# Patient Record
Sex: Female | Born: 1960 | Race: White | Hispanic: No | Marital: Married | Smoking: Former smoker
Health system: Southern US, Community
[De-identification: ages and names within clinical notes are randomized; demographics above are authoritative.]

## PROBLEM LIST (undated history)

## (undated) DIAGNOSIS — E039 Hypothyroidism, unspecified: Secondary | ICD-10-CM

## (undated) DIAGNOSIS — K219 Gastro-esophageal reflux disease without esophagitis: Secondary | ICD-10-CM

## (undated) DIAGNOSIS — I10 Essential (primary) hypertension: Secondary | ICD-10-CM

## (undated) DIAGNOSIS — E079 Disorder of thyroid, unspecified: Secondary | ICD-10-CM

## (undated) DIAGNOSIS — M797 Fibromyalgia: Secondary | ICD-10-CM

## (undated) DIAGNOSIS — F419 Anxiety disorder, unspecified: Secondary | ICD-10-CM

## (undated) HISTORY — PX: ABLATION: SHX5711

## (undated) HISTORY — PX: TONSILLECTOMY: SUR1361

---

## 2014-10-01 ENCOUNTER — Other Ambulatory Visit: Payer: Self-pay

## 2014-10-01 ENCOUNTER — Encounter: Payer: Self-pay | Admitting: Emergency Medicine

## 2014-10-01 ENCOUNTER — Emergency Department
Admission: EM | Admit: 2014-10-01 | Discharge: 2014-10-01 | Disposition: A | Discharge status: Home / Self Care | Payer: No Typology Code available for payment source | Attending: Emergency Medicine | Admitting: Emergency Medicine

## 2014-10-01 DIAGNOSIS — Z79899 Other long term (current) drug therapy: Secondary | ICD-10-CM | POA: Diagnosis not present

## 2014-10-01 DIAGNOSIS — R109 Unspecified abdominal pain: Secondary | ICD-10-CM | POA: Diagnosis present

## 2014-10-01 DIAGNOSIS — R197 Diarrhea, unspecified: Secondary | ICD-10-CM | POA: Insufficient documentation

## 2014-10-01 DIAGNOSIS — R1084 Generalized abdominal pain: Secondary | ICD-10-CM | POA: Insufficient documentation

## 2014-10-01 DIAGNOSIS — R110 Nausea: Secondary | ICD-10-CM | POA: Insufficient documentation

## 2014-10-01 DIAGNOSIS — Z7982 Long term (current) use of aspirin: Secondary | ICD-10-CM | POA: Insufficient documentation

## 2014-10-01 HISTORY — DX: Disorder of thyroid, unspecified: E07.9

## 2014-10-01 LAB — URINALYSIS COMPLETE WITH MICROSCOPIC (ARMC ONLY)
Bilirubin Urine: NEGATIVE
Glucose, UA: NEGATIVE mg/dL
Hgb urine dipstick: NEGATIVE
LEUKOCYTES UA: NEGATIVE
Nitrite: NEGATIVE
PH: 9 — AB (ref 5.0–8.0)
Protein, ur: 30 mg/dL — AB
Specific Gravity, Urine: 1.012 (ref 1.005–1.030)

## 2014-10-01 LAB — TROPONIN I

## 2014-10-01 LAB — CBC
HCT: 42.7 % (ref 35.0–47.0)
Hemoglobin: 14.2 g/dL (ref 12.0–16.0)
MCH: 31.4 pg (ref 26.0–34.0)
MCHC: 33.3 g/dL (ref 32.0–36.0)
MCV: 94.5 fL (ref 80.0–100.0)
PLATELETS: 265 10*3/uL (ref 150–440)
RBC: 4.51 MIL/uL (ref 3.80–5.20)
RDW: 13 % (ref 11.5–14.5)
WBC: 7.8 10*3/uL (ref 3.6–11.0)

## 2014-10-01 LAB — COMPREHENSIVE METABOLIC PANEL
ALBUMIN: 4.4 g/dL (ref 3.5–5.0)
ALT: 14 U/L (ref 14–54)
AST: 20 U/L (ref 15–41)
Alkaline Phosphatase: 26 U/L — ABNORMAL LOW (ref 38–126)
Anion gap: 10 (ref 5–15)
BILIRUBIN TOTAL: 0.5 mg/dL (ref 0.3–1.2)
BUN: 11 mg/dL (ref 6–20)
CALCIUM: 9.4 mg/dL (ref 8.9–10.3)
CO2: 32 mmol/L (ref 22–32)
Chloride: 103 mmol/L (ref 101–111)
Creatinine, Ser: 0.56 mg/dL (ref 0.44–1.00)
GFR calc Af Amer: >60 mL/min (ref >60)
GFR calc non Af Amer: >60 mL/min (ref >60)
Glucose, Bld: 100 mg/dL — ABNORMAL HIGH (ref 65–99)
Potassium: 3.8 mmol/L (ref 3.5–5.1)
Sodium: 145 mmol/L (ref 135–145)
TOTAL PROTEIN: 7.4 g/dL (ref 6.5–8.1)

## 2014-10-01 LAB — LIPASE, BLOOD: LIPASE: 45 U/L (ref 22–51)

## 2014-10-01 NOTE — ED Notes (Signed)
Pt report upper abd pain that started this am with nausea and SOB.. Last stool last night.

## 2014-10-01 NOTE — Discharge Instructions (Signed)
You have been seen in the Emergency Department (ED) for abdominal pain.  Your evaluation did not identify a clear cause of your symptoms but was generally reassuring.  Please follow up as instructed above regarding todays emergent visit and the symptoms that are bothering you.  Return to the ED if your abdominal pain worsens or fails to improve, you develop bloody vomiting, bloody diarrhea, you are unable to tolerate fluids due to vomiting, fever greater than 101, or other symptoms that concern you.   Abdominal Pain, Women Abdominal (stomach, pelvic, or belly) pain can be caused by many things. It is important to tell your doctor:  The location of the pain.  Does it come and go or is it present all the time?  Are there things that start the pain (eating certain foods, exercise)?  Are there other symptoms associated with the pain (fever, nausea, vomiting, diarrhea)? All of this is helpful to know when trying to find the cause of the pain. CAUSES   Stomach: virus or bacteria infection, or ulcer.  Intestine: appendicitis (inflamed appendix), regional ileitis (Crohn's disease), ulcerative colitis (inflamed colon), irritable bowel syndrome, diverticulitis (inflamed diverticulum of the colon), or cancer of the stomach or intestine.  Gallbladder disease or stones in the gallbladder.  Kidney disease, kidney stones, or infection.  Pancreas infection or cancer.  Fibromyalgia (pain disorder).  Diseases of the female organs:  Uterus: fibroid (non-cancerous) tumors or infection.  Fallopian tubes: infection or tubal pregnancy.  Ovary: cysts or tumors.  Pelvic adhesions (scar tissue).  Endometriosis (uterus lining tissue growing in the pelvis and on the pelvic organs).  Pelvic congestion syndrome (female organs filling up with blood just before the menstrual period).  Pain with the menstrual period.  Pain with ovulation (producing an egg).  Pain with an IUD (intrauterine device,  birth control) in the uterus.  Cancer of the female organs.  Functional pain (pain not caused by a disease, may improve without treatment).  Psychological pain.  Depression. DIAGNOSIS  Your doctor will decide the seriousness of your pain by doing an examination.  Blood tests.  X-rays.  Ultrasound.  CT scan (computed tomography, special type of X-ray).  MRI (magnetic resonance imaging).  Cultures, for infection.  Barium enema (dye inserted in the large intestine, to better view it with X-rays).  Colonoscopy (looking in intestine with a lighted tube).  Laparoscopy (minor surgery, looking in abdomen with a lighted tube).  Major abdominal exploratory surgery (looking in abdomen with a large incision). TREATMENT  The treatment will depend on the cause of the pain.   Many cases can be observed and treated at home.  Over-the-counter medicines recommended by your caregiver.  Prescription medicine.  Antibiotics, for infection.  Birth control pills, for painful periods or for ovulation pain.  Hormone treatment, for endometriosis.  Nerve blocking injections.  Physical therapy.  Antidepressants.  Counseling with a psychologist or psychiatrist.  Minor or major surgery. HOME CARE INSTRUCTIONS   Do not take laxatives, unless directed by your caregiver.  Take over-the-counter pain medicine only if ordered by your caregiver. Do not take aspirin because it can cause an upset stomach or bleeding.  Try a clear liquid diet (broth or water) as ordered by your caregiver. Slowly move to a bland diet, as tolerated, if the pain is related to the stomach or intestine.  Have a thermometer and take your temperature several times a day, and record it.  Bed rest and sleep, if it helps the pain.  Avoid sexual  intercourse, if it causes pain.  Avoid stressful situations.  Keep your follow-up appointments and tests, as your caregiver orders.  If the pain does not go away with  medicine or surgery, you may try:  Acupuncture.  Relaxation exercises (yoga, meditation).  Group therapy.  Counseling. SEEK MEDICAL CARE IF:   You notice certain foods cause stomach pain.  Your home care treatment is not helping your pain.  You need stronger pain medicine.  You want your IUD removed.  You feel faint or lightheaded.  You develop nausea and vomiting.  You develop a rash.  You are having side effects or an allergy to your medicine. SEEK IMMEDIATE MEDICAL CARE IF:   Your pain does not go away or gets worse.  You have a fever.  Your pain is felt only in portions of the abdomen. The right side could possibly be appendicitis. The left lower portion of the abdomen could be colitis or diverticulitis.  You are passing blood in your stools (bright red or black tarry stools, with or without vomiting).  You have blood in your urine.  You develop chills, with or without a fever.  You pass out. MAKE SURE YOU:   Understand these instructions.  Will watch your condition.  Will get help right away if you are not doing well or get worse. Document Released: 12/01/2006 Document Revised: 06/20/2013 Document Reviewed: 12/21/2008 [facility] Patient Information 2015 [place], [place]. This information is not intended to replace advice given to you by your health care provider. Make sure you discuss any questions you have with your health care provider.

## 2014-10-01 NOTE — ED Provider Notes (Signed)
[facility] Emergency Department Provider Note  ____________________________________________  Time seen: Approximately 11:51 AM  I have reviewed the triage vital signs and the nursing notes.   HISTORY  Chief Complaint Abdominal Pain    HPI Caitlin Tucker is a 54 y.o. female with no significant past medical history who resents with abdominal pain and distention that started this morning.  She states that she awoke feeling like she had gas pains which slowly and steadily got worse for several hours.She had no vomiting but some nausea.  When the pain was severe or she was also having some shortness of breath.  She describes the pain as generalized, bloating, and "gurgling ".  After she got to the emergency department she had a large volume loose stool and feels completely better.  She states she is ready to go.  She denies chest pain.  She denies fever/chills.   Past Medical History  Diagnosis Date  . Thyroid disease     There are no active problems to display for this patient.   Past Surgical History  Procedure Laterality Date  . Cesarean section    . Tonsillectomy    . Ablation      Current Outpatient Rx  Name  Route  Sig  Dispense  Refill  . aspirin EC 81 MG tablet   Oral   Take 81 mg by mouth daily.         . [facility] VITAMIN B12 1000 MCG tablet   Oral   Take 1,000 mcg by mouth daily.      11     Dispense as written.   Marland Kitchen levothyroxine (SYNTHROID, LEVOTHROID) 125 MCG tablet   Oral   Take 125 mcg by mouth daily.           Allergies Review of patient's allergies indicates no known allergies.  No family history on file.  Social History Social History  Substance Use Topics  . Smoking status: Never Smoker   . Smokeless tobacco: Never Used  . Alcohol Use: Yes    Review of Systems Constitutional: No fever/chills Eyes: No visual changes. ENT: No sore throat. Cardiovascular: Denies chest pain. Respiratory: Denies shortness of  breath. Gastrointestinal: Abdominal pain as described above with nausea.  One loose stool. Genitourinary: Negative for dysuria. Musculoskeletal: Negative for back pain. Skin: Negative for rash. Neurological: Negative for headaches, focal weakness or numbness.  10-point ROS otherwise negative.  ____________________________________________   PHYSICAL EXAM:  ED Triage Vitals  Enc Vitals Group     BP 10/01/14 1210 125/74 mmHg     Pulse Rate 10/01/14 1210 58     Resp 10/01/14 1210 18     Temp --      Temp src --      SpO2 --      Weight --      Height --      Head Cir --      Peak Flow --      Pain Score --      Pain Loc --      Pain Edu? --      Excl. in GC? --       Constitutional: Alert and oriented. Well appearing and in no acute distress. Eyes: Conjunctivae are normal. PERRL. EOMI. Head: Atraumatic. Nose: No congestion/rhinnorhea. Mouth/Throat: Mucous membranes are moist.  Oropharynx non-erythematous. Neck: No stridor.   Cardiovascular: Normal rate, regular rhythm. Grossly normal heart sounds.  Good peripheral circulation. Respiratory: Normal respiratory effort.  No retractions. Lungs  CTAB. Gastrointestinal: Soft and nontender. No distention. No abdominal bruits. No CVA tenderness. Musculoskeletal: No lower extremity tenderness nor edema.  No joint effusions. Neurologic:  Normal speech and language. No gross focal neurologic deficits are appreciated.  Skin:  Skin is warm, dry and intact. No rash noted. Psychiatric: Mood and affect are normal. Speech and behavior are normal.  ____________________________________________   LABS (all labs ordered are listed, but only abnormal results are displayed)  Labs Reviewed  COMPREHENSIVE METABOLIC PANEL - Abnormal; Notable for the following:    Glucose, Bld 100 (*)    Alkaline Phosphatase 26 (*)    All other components within normal limits  URINALYSIS COMPLETEWITH MICROSCOPIC ([facility] ONLY) - Abnormal; Notable for the  following:    Color, Urine STRAW (*)    APPearance CLEAR (*)    Ketones, ur TRACE (*)    pH 9.0 (*)    Protein, ur 30 (*)    Bacteria, UA RARE (*)    Squamous Epithelial / LPF 0-5 (*)    All other components within normal limits  LIPASE, BLOOD  CBC  TROPONIN I   ____________________________________________  EKG  ED ECG REPORT I, Edwinna Rochette, the attending physician, personally viewed and interpreted this ECG.  Date: 10/01/2014 EKG Time: 10:36 Rate: 69 Rhythm: normal sinus rhythm QRS Axis: normal Intervals: normal ST/T Wave abnormalities: normal Conduction Disutrbances: none Narrative Interpretation: unremarkable  ____________________________________________  RADIOLOGY  Not indicated  ____________________________________________   PROCEDURES  Procedure(s) performed: None  Critical Care performed: No ____________________________________________   INITIAL IMPRESSION / ASSESSMENT AND PLAN / ED COURSE  Pertinent labs & imaging results that were available during my care of the patient were reviewed by me and considered in my medical decision making (see chart for details).  The patient is well-appearing, in no acute distress, has a normal heart rate and blood pressure, and has no abdominal tenderness.  She feels much better after a bowel movement.  I gave her my usual customary return precautions.  ____________________________________________  FINAL CLINICAL IMPRESSION(S) / ED DIAGNOSES  Final diagnoses:  Generalized abdominal pain      NEW MEDICATIONS STARTED DURING THIS VISIT:  New Prescriptions   No medications on file     Loleta Rose, MD 10/01/14 1734

## 2017-01-04 ENCOUNTER — Other Ambulatory Visit: Payer: Self-pay

## 2017-01-04 ENCOUNTER — Encounter: Payer: Self-pay | Admitting: Emergency Medicine

## 2017-01-04 ENCOUNTER — Ambulatory Visit
Admission: EM | Admit: 2017-01-04 | Discharge: 2017-01-04 | Disposition: A | Discharge status: Home / Self Care | Payer: 59 | Attending: Family Medicine | Admitting: Family Medicine

## 2017-01-04 DIAGNOSIS — J988 Other specified respiratory disorders: Secondary | ICD-10-CM

## 2017-01-04 DIAGNOSIS — R05 Cough: Secondary | ICD-10-CM

## 2017-01-04 DIAGNOSIS — H9201 Otalgia, right ear: Secondary | ICD-10-CM | POA: Diagnosis not present

## 2017-01-04 MED ORDER — HYDROCOD POLST-CPM POLST ER 10-8 MG/5ML PO SUER: 5.0000 mL | Freq: Two times a day (BID) | ORAL | 0 refills | Status: DC | PRN

## 2017-01-04 MED ORDER — FLUTICASONE PROPIONATE 50 MCG/ACT NA SUSP: 2.0000 | Freq: Every day | NASAL | 0 refills | Status: AC

## 2017-01-04 NOTE — Discharge Instructions (Signed)
This is viral.  Use flonase and sudafed for your congestion/ear pain.  Tussionex for cough.  Take care  Dr. Adriana Simasook

## 2017-01-04 NOTE — ED Triage Notes (Signed)
Patient c/o right ear pain, cough, chest congestion, and bodyaches that started Thursday.  Patient denies fevers.

## 2017-01-04 NOTE — ED Provider Notes (Signed)
[facility]    CSN: [id number] Arrival date & time: 01/04/17  0805  History   Chief Complaint Chief Complaint  Patient presents with  . Otalgia    right ear  . Cough  . Generalized Body Aches   HPI  56 year old female presents with the above complaints.  Patient reports she has been sick since Thursday.  She has had severe right ear pain, severe cough, and body aches.  No fever.  Cough is slightly productive.  She been taking Alka-Seltzer without improvement.  No known exacerbating relieving factors.  No other associated symptoms.  No other complaints this time.  Past Medical History:  Diagnosis Date  . Thyroid disease    Past Surgical History:  Procedure Laterality Date  . ABLATION    . CESAREAN SECTION    . TONSILLECTOMY     OB History    No data available     Home Medications    Prior to Admission medications   Medication Sig Start Date End Date Taking? Authorizing Provider  aspirin EC 81 MG tablet Take 81 mg by mouth daily.   Yes [provider]  [facility]S VITAMIN B12 1000 MCG tablet Take 1,000 mcg by mouth daily. 08/27/14  Yes [provider]  levothyroxine (SYNTHROID, LEVOTHROID) 125 MCG tablet Take 125 mcg by mouth daily. 09/30/14  Yes [provider]  chlorpheniramine-HYDROcodone Stevphen Meuse(TUSSIONEX PENNKINETIC ER) 10-8 MG/5ML SUER Take 5 mLs every 12 (twelve) hours as needed by mouth. 01/04/17   Eliane Hammersmith, Verdis FredericksonJayce G, DO  fluticasone (FLONASE) 50 MCG/ACT nasal spray Place 2 sprays daily into both nostrils. 01/04/17   Tommie Samsook, Jordynn Perrier G, DO    Family History Family History  Problem Relation Age of Onset  . Atrial fibrillation Mother   . Hypertension Father     Social History Social History   Tobacco Use  . Smoking status: Never Smoker  . Smokeless tobacco: Never Used  Substance Use Topics  . Alcohol use: Yes  . Drug use: No     Allergies   Patient has no known allergies.   Review of Systems Review of Systems  Constitutional:  Negative for fever.  HENT: Positive for ear pain.   Respiratory: Positive for cough.   Musculoskeletal:       Body aches.   All other systems reviewed and are negative.  Physical Exam Triage Vital Signs ED Triage Vitals  Enc Vitals Group     BP 01/04/17 0826 130/87     Pulse Rate 01/04/17 0826 81     Resp 01/04/17 0826 16     Temp 01/04/17 0826 98.9 F (37.2 C)     Temp Source 01/04/17 0826 Oral     SpO2 01/04/17 0826 99 %     Weight 01/04/17 0822 124 lb 9 oz (56.5 kg)     Height 01/04/17 0822 4\' 11"  (1.499 m)     Head Circumference --      Peak Flow --      Pain Score 01/04/17 0823 8     Pain Loc --      Pain Edu? --      Excl. in GC? --    Updated Vital Signs BP 130/87 (BP Location: Right Arm)   Pulse 81   Temp 98.9 F (37.2 C) (Oral)   Resp 16   Ht 4\' 11"  (1.499 m)   Wt 124 lb 9 oz (56.5 kg)   SpO2 99%   BMI 25.16 kg/m   Physical Exam  Constitutional:  She is oriented to person, place, and time. She appears well-developed. No distress.  HENT:  Head: Normocephalic and atraumatic.  Mouth/Throat: Oropharynx is clear and moist.  Eyes: Conjunctivae are normal. Right eye exhibits no discharge. Left eye exhibits no discharge. No scleral icterus.  Neck: Neck supple.  Cardiovascular: Normal rate and regular rhythm.  No murmur heard. Pulmonary/Chest: Effort normal and breath sounds normal. No respiratory distress. She has no wheezes. She has no rales.  Lymphadenopathy:    She has no cervical adenopathy.  Neurological: She is alert and oriented to person, place, and time. She exhibits normal muscle tone.  Skin: Skin is warm. No rash noted.  Psychiatric: She has a normal mood and affect. Her behavior is normal.  Vitals reviewed.  UC Treatments / Results  Labs (all labs ordered are listed, but only abnormal results are displayed) Labs Reviewed - No data to display  EKG  EKG Interpretation None       Radiology No results found.  Procedures Procedures  (including critical care time)  Medications Ordered in UC Medications - No data to display   Initial Impression / Assessment and Plan / UC Course  I have reviewed the triage vital signs and the nursing notes.  Pertinent labs & imaging results that were available during my care of the patient were reviewed by me and considered in my medical decision making (see chart for details).     56 year old female presents with a respiratory infection.  No indication for antibiotics.  Treating with Flonase and Tussionex.  Final Clinical Impressions(s) / UC Diagnoses   Final diagnoses:  Respiratory infection    ED Discharge Orders        Ordered    fluticasone (FLONASE) 50 MCG/ACT nasal spray  Daily     01/04/17 0900    chlorpheniramine-HYDROcodone (TUSSIONEX PENNKINETIC ER) 10-8 MG/5ML SUER  Every 12 hours PRN     01/04/17 0900     Controlled Substance Prescriptions [place] Controlled Substance Registry consulted? No   Tommie SamsCook, Latyra Jaye G, [place]DO 01/04/17 [id number]

## 2017-11-16 ENCOUNTER — Ambulatory Visit (INDEPENDENT_AMBULATORY_CARE_PROVIDER_SITE_OTHER): Payer: 59

## 2017-11-16 ENCOUNTER — Other Ambulatory Visit: Payer: Self-pay

## 2017-11-16 ENCOUNTER — Ambulatory Visit
Admission: EM | Admit: 2017-11-16 | Discharge: 2017-11-16 | Disposition: A | Discharge status: Home / Self Care | Payer: 59 | Attending: Family Medicine | Admitting: Family Medicine

## 2017-11-16 ENCOUNTER — Encounter: Payer: Self-pay | Admitting: Emergency Medicine

## 2017-11-16 DIAGNOSIS — M542 Cervicalgia: Secondary | ICD-10-CM

## 2017-11-16 DIAGNOSIS — S161XXA Strain of muscle, fascia and tendon at neck level, initial encounter: Secondary | ICD-10-CM

## 2017-11-16 DIAGNOSIS — M47812 Spondylosis without myelopathy or radiculopathy, cervical region: Secondary | ICD-10-CM

## 2017-11-16 DIAGNOSIS — M5412 Radiculopathy, cervical region: Secondary | ICD-10-CM

## 2017-11-16 DIAGNOSIS — L989 Disorder of the skin and subcutaneous tissue, unspecified: Secondary | ICD-10-CM
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
## 2017-11-16 MED ORDER — CYCLOBENZAPRINE HCL 10 MG PO TABS: 10.0000 mg | ORAL_TABLET | Freq: Every day | ORAL | 0 refills | Status: DC

## 2017-11-16 NOTE — Discharge Instructions (Addendum)
Anti-inflammatory,heat,stretching Follow up with primary care provider for further evaluation and management

## 2017-11-16 NOTE — ED Triage Notes (Signed)
Pt c/o neck pain, headache, numbness of her right arm, and hands. Pt reports that her neck hurts when she turn her neck and when she turns her neck it "cracks" a lot. Numbness started yesterday, but her neck pain have been getting worse for about a month.
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
## 2019-05-04 ENCOUNTER — Other Ambulatory Visit: Payer: Self-pay | Admitting: Physical Medicine & Rehabilitation

## 2019-05-04 DIAGNOSIS — M5412 Radiculopathy, cervical region: Secondary | ICD-10-CM

## 2019-05-06 ENCOUNTER — Ambulatory Visit
Admission: RE | Admit: 2019-05-06 | Discharge: 2019-05-06 | Disposition: A | Discharge status: Home / Self Care | Payer: Self-pay | Source: Ambulatory Visit | Attending: Physical Medicine & Rehabilitation | Admitting: Physical Medicine & Rehabilitation

## 2019-05-06 DIAGNOSIS — M5412 Radiculopathy, cervical region: Secondary | ICD-10-CM

## 2019-05-06 IMAGING — XA DG INJECT/[PERSON_NAME] INC NEEDLE/CATH/PLC EPI/CERV/THOR W/IMG
2 series · 2 of 2 positions shown · non-contrast
Comparison: none

CLINICAL DATA: Cervical radiculopathy. Displacement of the cervical
disc at C5-6. Right upper extremity radiculopathy.

[Series 1: ortho adipose · 1 of 1 slices shown (1 of 2)]
[im 1/1]
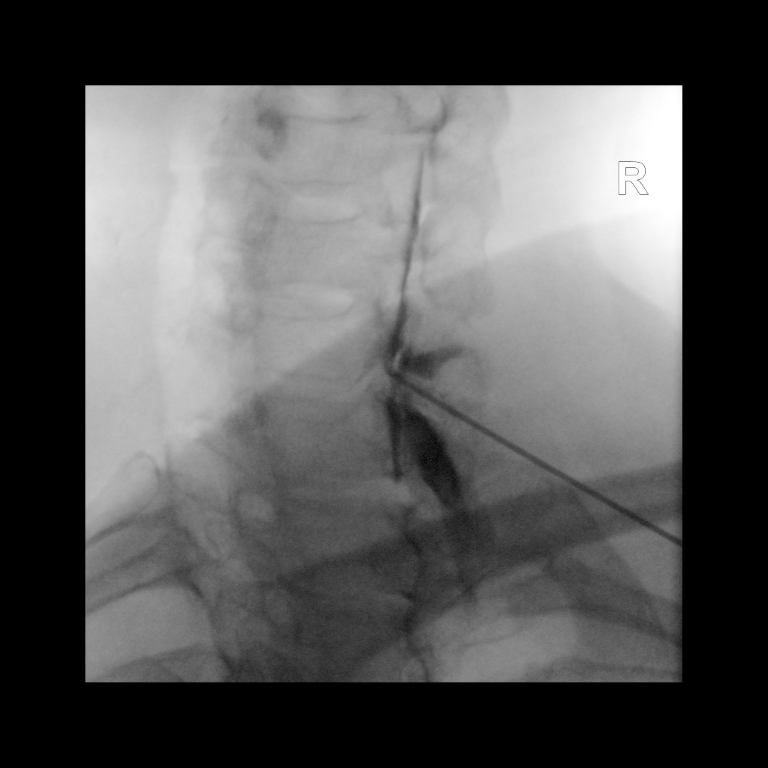

[Series 2: ortho adipose · 1 of 1 slices shown (2 of 2)]
[im 1/1]
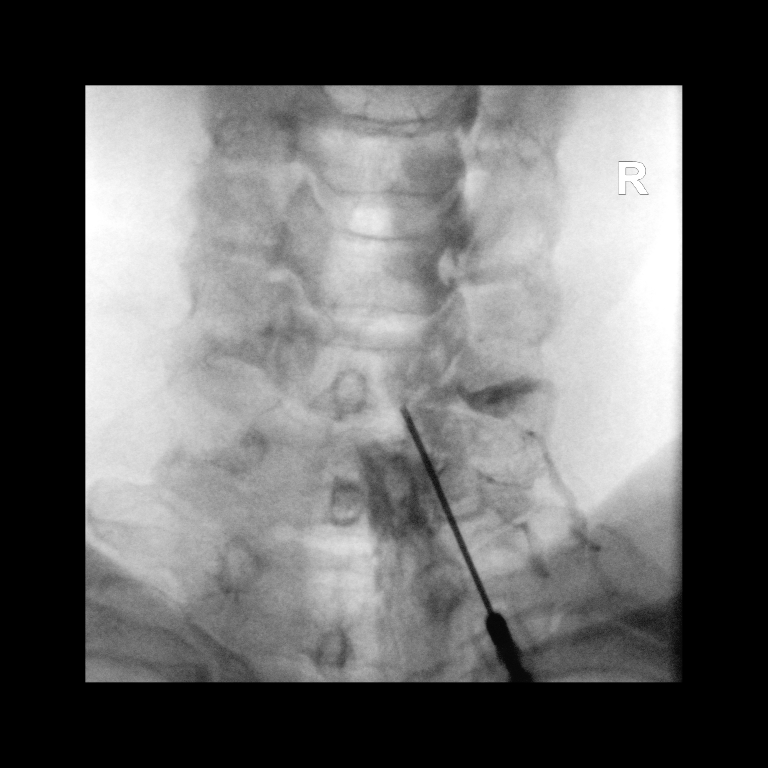

[2 of 2 positions shown; findings below may reference images not displayed]

FLUOROSCOPY TIME:  Radiation Exposure Index (as provided by the
fluoroscopic device): 12.32 uGy*m2

PROCEDURE:
CERVICAL EPIDURAL INJECTION

An interlaminar approach was performed on the right at C6-7. A 20
gauge epidural needle was advanced using loss-of-resistance
technique.

DIAGNOSTIC EPIDURAL INJECTION

Injection of Isovue-M 300 shows a good epidural pattern with spread
above and below the level of needle placement, primarily on the
right. No vascular opacification is seen. THERAPEUTIC

EPIDURAL INJECTION

1.5 ml of Kenalog 40 mixed with 1 ml of 1% Lidocaine and 2 ml of
normal saline were then instilled. The procedure was well-tolerated,
and the patient was discharged thirty minutes following the
injection in good condition.
IMPRESSION: Technically successful first epidural injection on the right at
C6-7.

## 2019-05-06 MED ORDER — TRIAMCINOLONE ACETONIDE 40 MG/ML IJ SUSP (RADIOLOGY)
60.0000 mg | Freq: Once | INTRAMUSCULAR | Status: AC
  Administered 2019-05-06: 60 mg via EPIDURAL

## 2019-05-06 MED ORDER — IOPAMIDOL (ISOVUE-M 300) INJECTION 61%
1.0000 mL | Freq: Once | INTRAMUSCULAR | Status: AC | PRN
  Administered 2019-05-06: 1 mL via EPIDURAL

## 2019-05-06 NOTE — Discharge Instructions (Signed)

## 2019-05-09 ENCOUNTER — Other Ambulatory Visit: Payer: Self-pay

## 2019-06-06 ENCOUNTER — Other Ambulatory Visit: Payer: Self-pay | Admitting: Physical Medicine & Rehabilitation

## 2019-06-06 DIAGNOSIS — M542 Cervicalgia: Secondary | ICD-10-CM

## 2019-06-06 DIAGNOSIS — M5412 Radiculopathy, cervical region: Secondary | ICD-10-CM

## 2019-06-17 ENCOUNTER — Other Ambulatory Visit: Payer: Self-pay

## 2019-06-17 ENCOUNTER — Ambulatory Visit
Admission: RE | Admit: 2019-06-17 | Discharge: 2019-06-17 | Disposition: A | Discharge status: Home / Self Care | Payer: No Typology Code available for payment source | Source: Ambulatory Visit | Attending: Physical Medicine & Rehabilitation | Admitting: Physical Medicine & Rehabilitation

## 2019-06-17 DIAGNOSIS — M5412 Radiculopathy, cervical region: Secondary | ICD-10-CM

## 2019-06-17 DIAGNOSIS — M542 Cervicalgia: Secondary | ICD-10-CM

## 2019-06-17 IMAGING — XA DG INJECT/[PERSON_NAME] INC NEEDLE/CATH/PLC EPI/CERV/THOR W/IMG
2 series · 2 of 2 positions shown · non-contrast
Comparison: none

CLINICAL DATA: Bilateral neck and arm discomfort. Right lower
extremity discomfort. Patient got improvement from the previous
injection but with recurrence of some symptoms.

[Series 1: ortho standard · 1 of 1 slices shown (1 of 2)]
[im 1/1]
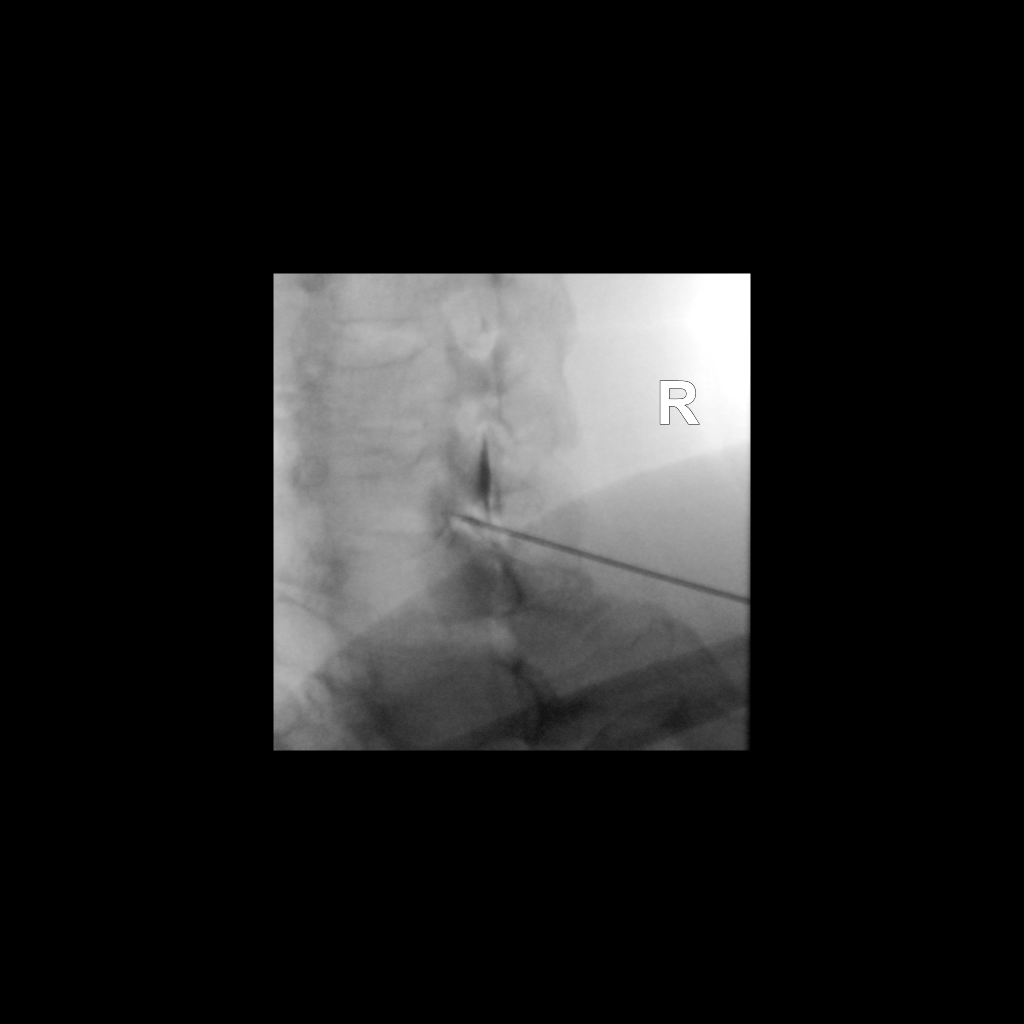

[Series 2: ortho standard · 1 of 1 slices shown (2 of 2)]
[im 1/1]
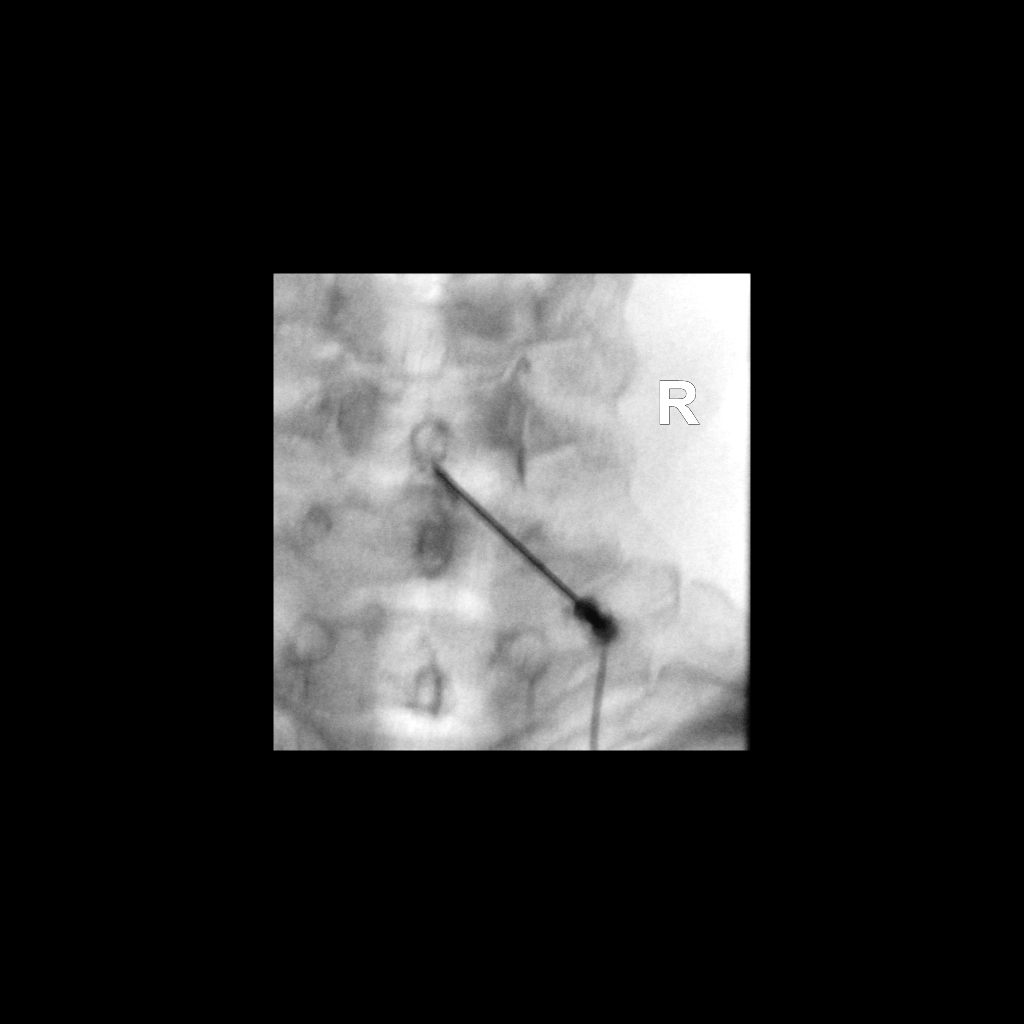

[2 of 2 positions shown; findings below may reference images not displayed]

FLUOROSCOPY TIME:  0 minutes 32 seconds. 10.79 micro gray meter
squared

PROCEDURE:
CERVICAL EPIDURAL INJECTION

An interlaminar approach was performed on the right at C7. A 20
gauge epidural needle was advanced using loss-of-resistance
technique.

DIAGNOSTIC EPIDURAL INJECTION

Injection of Isovue-M 300 shows a good epidural pattern with spread
above and below the level of needle placement, primarily on the
right, but to both sides. No vascular opacification is seen.
THERAPEUTIC

EPIDURAL INJECTION

1.5 ml of Kenalog 40 mixed with 1 ml of 1% Lidocaine and 2 ml of
normal saline were then instilled. The procedure was well-tolerated,
and the patient was discharged thirty minutes following the
injection in good condition.
IMPRESSION: Technically successful second epidural injection on the right at C7.

## 2019-06-17 MED ORDER — IOPAMIDOL (ISOVUE-M 300) INJECTION 61%
1.0000 mL | Freq: Once | INTRAMUSCULAR | Status: AC
  Administered 2019-06-17: 1 mL via EPIDURAL

## 2019-06-17 MED ORDER — TRIAMCINOLONE ACETONIDE 40 MG/ML IJ SUSP (RADIOLOGY)
60.0000 mg | Freq: Once | INTRAMUSCULAR | Status: AC
  Administered 2019-06-17: 60 mg via EPIDURAL

## 2019-06-17 NOTE — Discharge Instructions (Signed)

## 2019-06-21 ENCOUNTER — Other Ambulatory Visit: Payer: Self-pay | Admitting: Physical Medicine & Rehabilitation

## 2019-06-21 DIAGNOSIS — G8929 Other chronic pain: Secondary | ICD-10-CM

## 2019-06-21 DIAGNOSIS — M5412 Radiculopathy, cervical region: Secondary | ICD-10-CM

## 2019-06-21 DIAGNOSIS — M5441 Lumbago with sciatica, right side: Secondary | ICD-10-CM

## 2019-07-04 ENCOUNTER — Other Ambulatory Visit: Payer: Self-pay | Admitting: Physical Medicine & Rehabilitation

## 2019-07-04 ENCOUNTER — Ambulatory Visit: Payer: PRIVATE HEALTH INSURANCE

## 2019-07-04 DIAGNOSIS — M542 Cervicalgia: Secondary | ICD-10-CM

## 2019-07-22 ENCOUNTER — Inpatient Hospital Stay: Admission: RE | Admit: 2019-07-22 | Payer: PRIVATE HEALTH INSURANCE | Source: Ambulatory Visit

## 2019-08-05 ENCOUNTER — Other Ambulatory Visit: Payer: PRIVATE HEALTH INSURANCE

## 2019-08-19 ENCOUNTER — Ambulatory Visit
Admission: RE | Admit: 2019-08-19 | Discharge: 2019-08-19 | Disposition: A | Discharge status: Home / Self Care | Payer: PRIVATE HEALTH INSURANCE | Source: Ambulatory Visit | Attending: Physical Medicine & Rehabilitation | Admitting: Physical Medicine & Rehabilitation

## 2019-08-19 ENCOUNTER — Other Ambulatory Visit: Payer: Self-pay

## 2019-08-19 DIAGNOSIS — M542 Cervicalgia: Secondary | ICD-10-CM

## 2019-08-19 IMAGING — XA DG INJECT/[PERSON_NAME] INC NEEDLE/CATH/PLC EPI/CERV/THOR W/IMG
2 series · 2 of 2 positions shown · non-contrast
Comparison: none

CLINICAL DATA: Cervical spondylosis without myelopathy. Pain in the
neck, shoulders, and arms, right greater than left. Partial
improvement following 2 previous epidural injections.

[Series 1: ortho standard · 1 of 1 slices shown (1 of 2)]
[im 1/1]
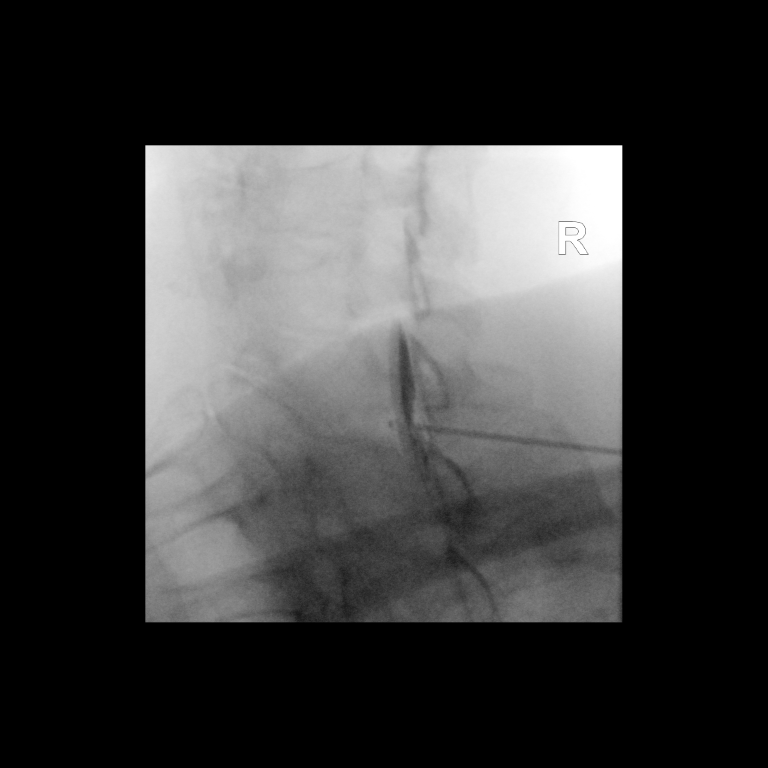

[Series 2: ortho standard · 1 of 1 slices shown (2 of 2)]
[im 1/1]
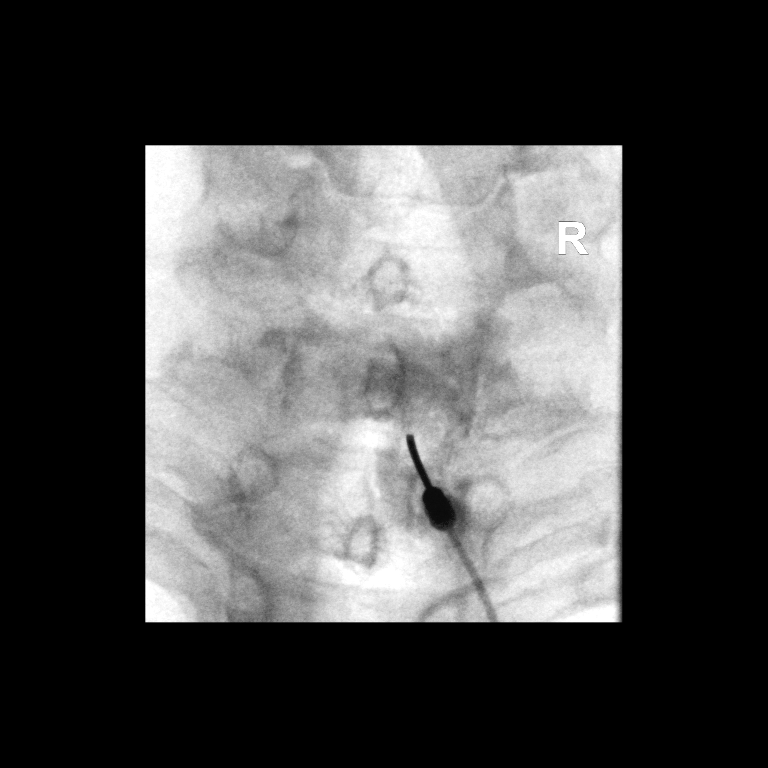

[2 of 2 positions shown; findings below may reference images not displayed]

FLUOROSCOPY TIME:  Fluoroscopy Time: 18 seconds

Radiation Exposure Index: 9.04 microGray*m^2

PROCEDURE:
The procedure, risks, benefits, and alternatives were explained to
the patient. Questions regarding the procedure were encouraged and
answered. The patient understands and consents to the procedure.

CERVICAL EPIDURAL INJECTION

An interlaminar approach was performed on the right at C7-T1. A
inch 20 gauge epidural needle was advanced using loss-of-resistance
technique.

DIAGNOSTIC EPIDURAL INJECTION

Injection of Isovue-M 300 shows a good epidural pattern with spread
above and below the level of needle placement, primarily on the
right. No vascular opacification is seen.

THERAPEUTIC EPIDURAL INJECTION

1.5 ml of Kenalog 40 mixed with 2 ml of normal saline were then
instilled. The procedure was well-tolerated, and the patient was
discharged thirty minutes following the injection in good condition.
IMPRESSION: Technically successful interlaminar epidural injection on the right
at C7-T1.

## 2019-08-19 MED ORDER — IOPAMIDOL (ISOVUE-M 300) INJECTION 61%
1.0000 mL | Freq: Once | INTRAMUSCULAR | Status: AC | PRN
  Administered 2019-08-19: 1 mL via EPIDURAL

## 2019-08-19 MED ORDER — TRIAMCINOLONE ACETONIDE 40 MG/ML IJ SUSP (RADIOLOGY)
60.0000 mg | Freq: Once | INTRAMUSCULAR | Status: AC
  Administered 2019-08-19: 60 mg via EPIDURAL

## 2019-08-19 NOTE — Discharge Instructions (Signed)

## 2019-10-28 ENCOUNTER — Other Ambulatory Visit: Payer: Self-pay | Admitting: Physical Medicine & Rehabilitation

## 2019-10-28 DIAGNOSIS — M5441 Lumbago with sciatica, right side: Secondary | ICD-10-CM

## 2019-11-09 ENCOUNTER — Ambulatory Visit (HOSPITAL_COMMUNITY): Payer: BC Managed Care – PPO

## 2019-11-15 ENCOUNTER — Other Ambulatory Visit: Payer: Self-pay

## 2019-11-15 ENCOUNTER — Ambulatory Visit (HOSPITAL_COMMUNITY)
Admission: RE | Admit: 2019-11-15 | Discharge: 2019-11-15 | Disposition: A | Discharge status: Home / Self Care | Payer: BC Managed Care – PPO | Source: Ambulatory Visit | Attending: Physical Medicine & Rehabilitation | Admitting: Physical Medicine & Rehabilitation

## 2019-11-15 DIAGNOSIS — G8929 Other chronic pain: Secondary | ICD-10-CM | POA: Diagnosis present

## 2019-11-15 DIAGNOSIS — M5441 Lumbago with sciatica, right side: Secondary | ICD-10-CM | POA: Insufficient documentation

## 2019-11-15 DIAGNOSIS — M5412 Radiculopathy, cervical region: Secondary | ICD-10-CM

## 2019-11-15 IMAGING — MR MR LUMBAR SPINE W/O CM
5 series · 31 of 48 positions shown · non-contrast
Comparison: None.

CLINICAL DATA: Low back pain with right leg pain

EXAM:
MRI LUMBAR SPINE WITHOUT CONTRAST
TECHNIQUE: Multiplanar, multisequence MR imaging of the lumbar spine was
performed. No intravenous contrast was administered.

[Series 5: T1 · sagittal · 4.0mm · 0.55mm/px · 6 of 17 slices shown (1 of 2)]
[im 1/17]
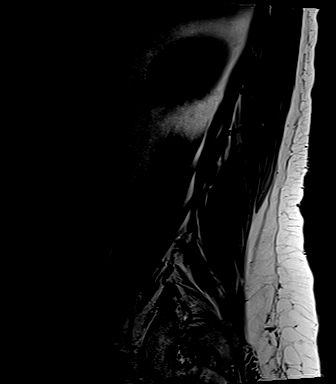
[im 4/17]
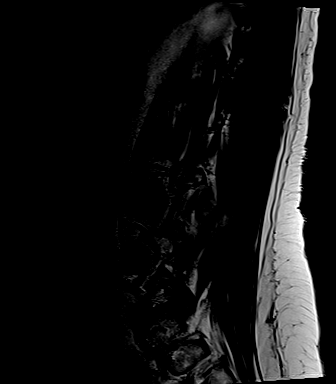
[im 7/17]
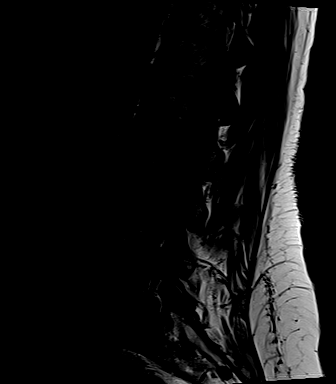
[im 10/17]
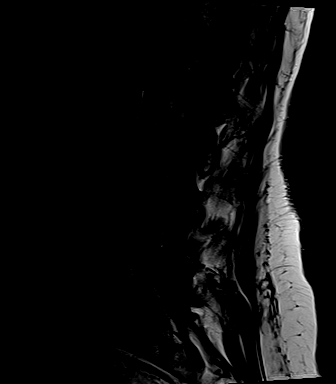
[im 13/17]
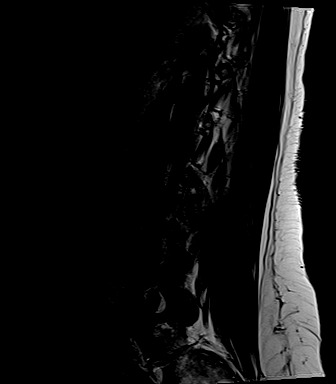
[im 17/17]
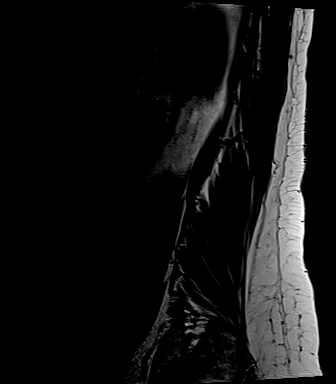

[Series 6: T2 · sagittal · 4.0mm · 0.55mm/px · 7 of 17 slices shown (1 of 2)]
[im 1/17]
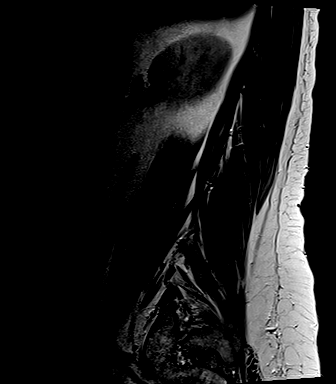
[im 3/17]
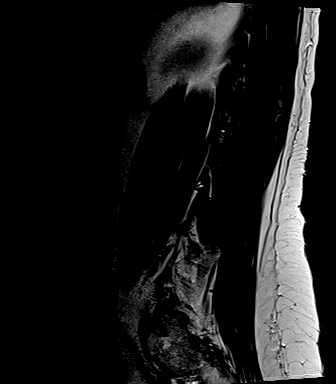
[im 6/17]
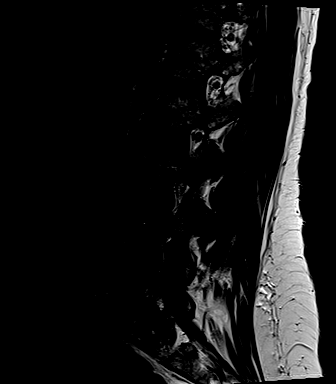
[im 9/17]
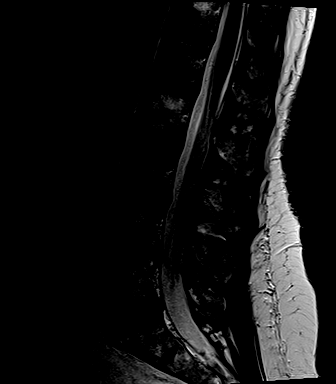
[im 11/17]
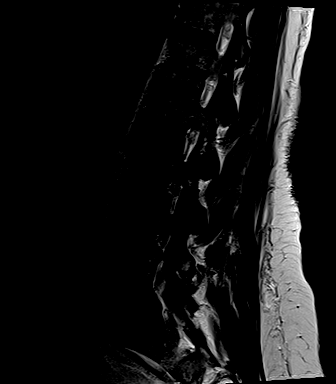
[im 14/17]
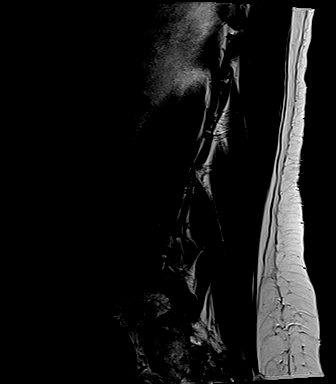
[im 17/17]
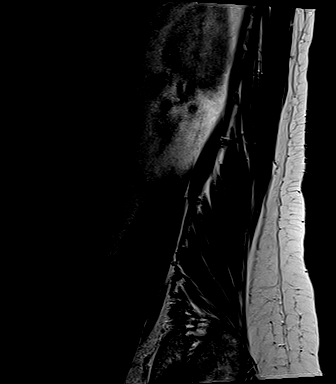

[Series 7: STIR · sagittal · 4.0mm · 0.41mm/px · 2 of 17 slices shown]
[im 1/17]
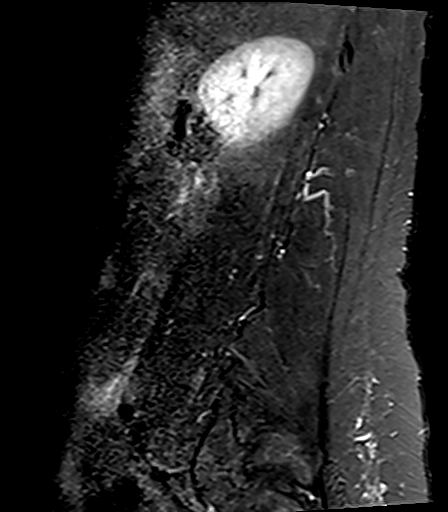
[im 3/17]
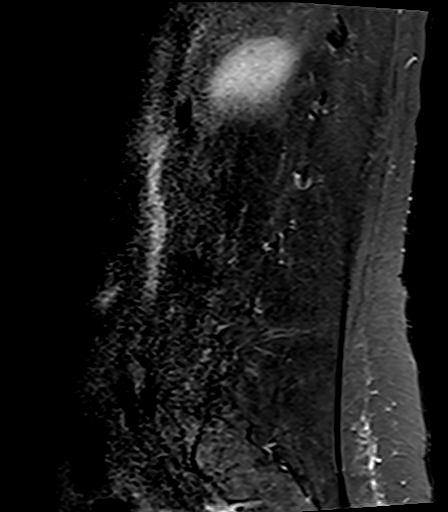

[Series 8: T2 · axial · 4.0mm · 0.62mm/px · z∈[-79,+112]mm · 8 of 33 slices shown (2 of 2)]
[im 1/33]
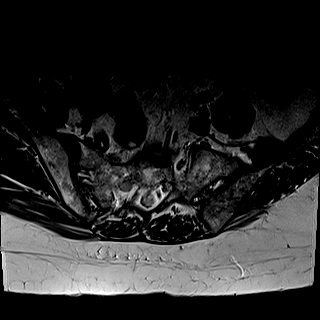
[im 5/33]
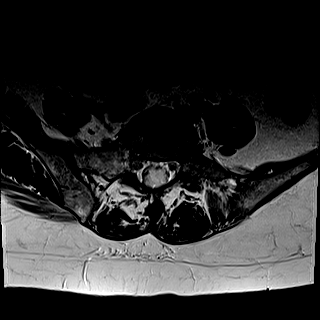
[im 10/33]
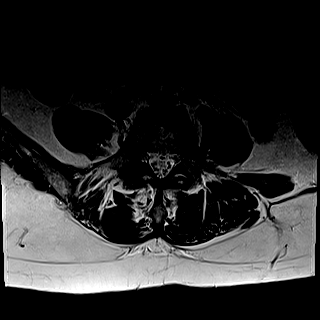
[im 15/33]
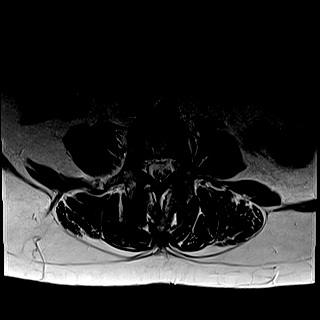
[im 18/33]
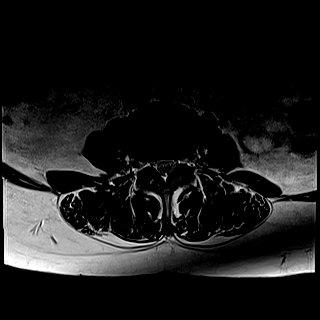
[im 23/33]
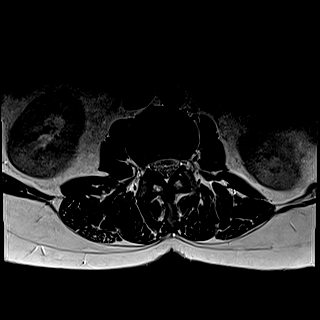
[im 28/33]
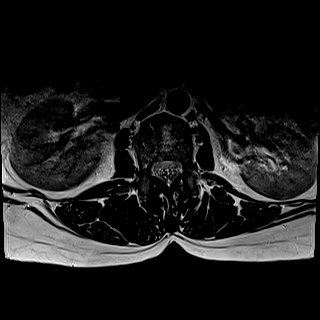
[im 33/33]
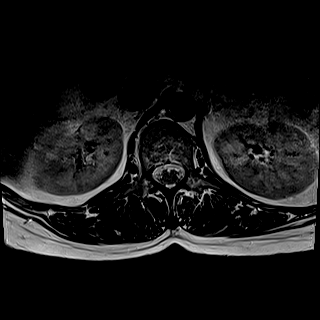

[Series 9: T1 · axial · 4.0mm · 0.39mm/px · z∈[-79,+112]mm · 8 of 33 slices shown (2 of 2)]
[im 1/33]
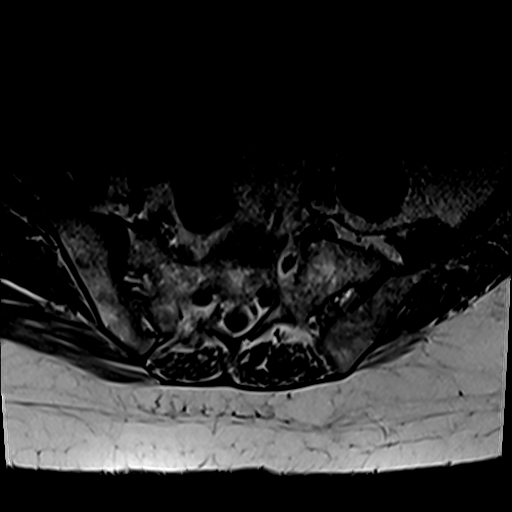
[im 5/33]
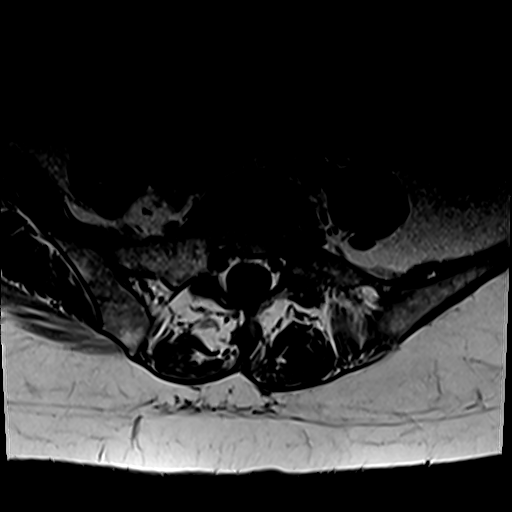
[im 10/33]
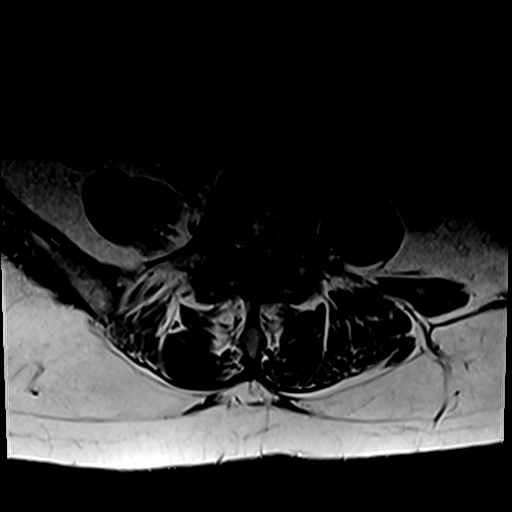
[im 15/33]
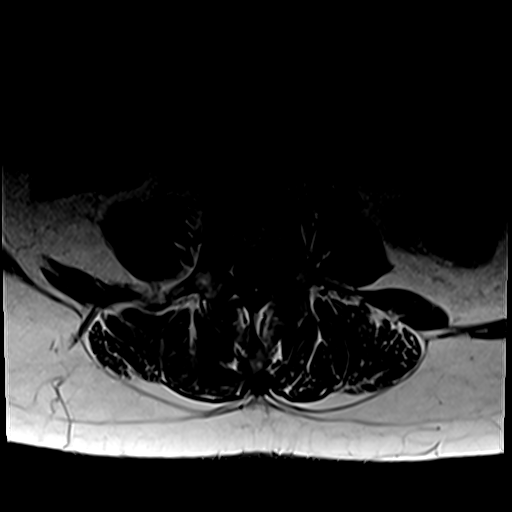
[im 18/33]
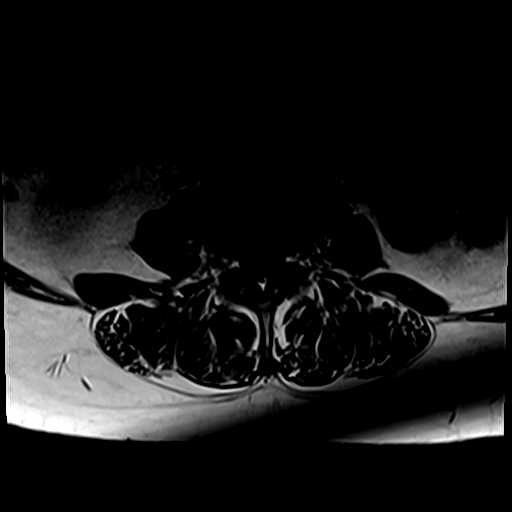
[im 23/33]
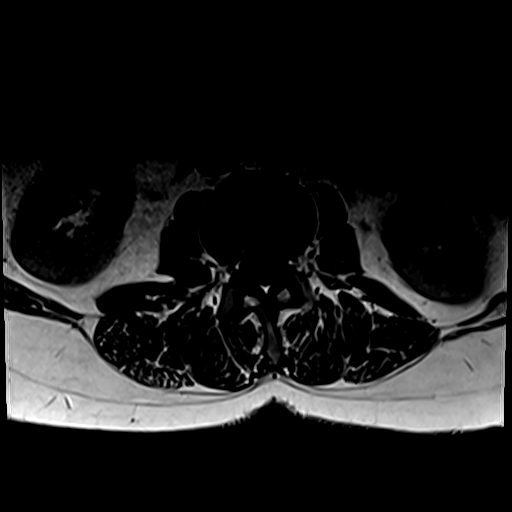
[im 28/33]
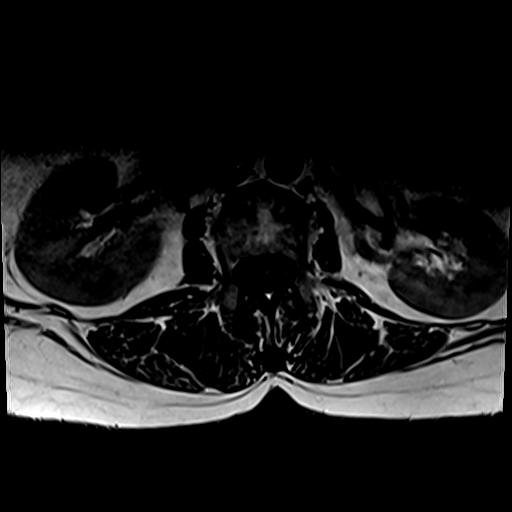
[im 33/33]
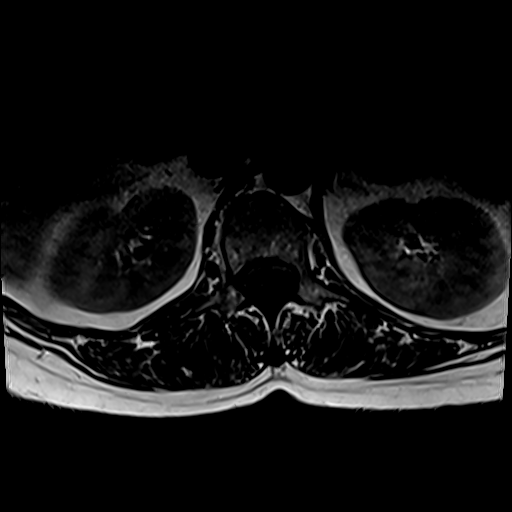

[31 of 48 positions shown; findings below may reference images not displayed]

FINDINGS: Segmentation:  Normal

Alignment:  Mild retrolisthesis L3-4 L4-5

Vertebrae: Negative for fracture or mass. Scattered fatty deposits
in the bone marrow with a benign appearance.

Conus medullaris and cauda equina: Conus extends to the L1-2 level.
Conus and cauda equina appear normal.

Paraspinal and other soft tissues: Negative for paraspinous mass or
adenopathy.

Disc levels:

L1-2: Negative

L2-3: Mild degenerative change.  Negative for stenosis

L3-4: Mild disc and facet degeneration. Mild retrolisthesis. No
significant stenosis

L4-5: Mild retrolisthesis. Diffuse disc bulging and mild endplate
spurring. Mild facet degeneration. Mild to moderate spinal stenosis.
Moderate right subarticular stenosis and mild left subarticular
stenosis.

L5-S1: Mild degenerative change.  Negative for stenosis.
IMPRESSION: Mild to moderate spinal stenosis L4-5. Mild retrolisthesis with disc
and facet degeneration. Moderate right subarticular stenosis and
mild left subarticular stenosis.

## 2019-11-18 ENCOUNTER — Other Ambulatory Visit (HOSPITAL_COMMUNITY): Payer: Self-pay

## 2019-11-18 ENCOUNTER — Other Ambulatory Visit: Payer: No Typology Code available for payment source

## 2019-12-01 ENCOUNTER — Other Ambulatory Visit: Payer: BC Managed Care – PPO

## 2019-12-05 ENCOUNTER — Ambulatory Visit: Admit: 2019-12-05 | Payer: BC Managed Care – PPO

## 2019-12-05 SURGERY — EGD (ESOPHAGOGASTRODUODENOSCOPY)
Anesthesia: General

## 2020-01-16 ENCOUNTER — Other Ambulatory Visit: Payer: Self-pay | Admitting: Physical Medicine & Rehabilitation

## 2020-01-16 ENCOUNTER — Other Ambulatory Visit (HOSPITAL_COMMUNITY): Payer: Self-pay | Admitting: Physical Medicine & Rehabilitation

## 2020-01-16 DIAGNOSIS — M5412 Radiculopathy, cervical region: Secondary | ICD-10-CM

## 2020-01-25 ENCOUNTER — Other Ambulatory Visit: Payer: Self-pay | Admitting: Physical Medicine & Rehabilitation

## 2020-01-25 DIAGNOSIS — M5414 Radiculopathy, thoracic region: Secondary | ICD-10-CM

## 2020-01-25 DIAGNOSIS — M7918 Myalgia, other site: Secondary | ICD-10-CM

## 2020-01-27 ENCOUNTER — Other Ambulatory Visit: Payer: Self-pay

## 2020-01-27 ENCOUNTER — Other Ambulatory Visit: Payer: Self-pay | Admitting: Physical Medicine & Rehabilitation

## 2020-01-27 ENCOUNTER — Ambulatory Visit
Admission: RE | Admit: 2020-01-27 | Discharge: 2020-01-27 | Disposition: A | Discharge status: Home / Self Care | Payer: BC Managed Care – PPO | Source: Ambulatory Visit | Attending: Physical Medicine & Rehabilitation | Admitting: Physical Medicine & Rehabilitation

## 2020-01-27 DIAGNOSIS — M546 Pain in thoracic spine: Secondary | ICD-10-CM | POA: Diagnosis present

## 2020-01-27 DIAGNOSIS — S22000A Wedge compression fracture of unspecified thoracic vertebra, initial encounter for closed fracture: Secondary | ICD-10-CM

## 2020-01-27 DIAGNOSIS — M7918 Myalgia, other site: Secondary | ICD-10-CM | POA: Insufficient documentation

## 2020-01-27 DIAGNOSIS — M5412 Radiculopathy, cervical region: Secondary | ICD-10-CM | POA: Insufficient documentation

## 2020-01-27 DIAGNOSIS — M5414 Radiculopathy, thoracic region: Secondary | ICD-10-CM | POA: Insufficient documentation

## 2020-01-27 IMAGING — CT CT T SPINE W/O CM
3 of 4 series · 9 of 33 positions shown, 10 images · non-contrast
Comparison: MRI [DATE]

CLINICAL DATA: Abnormal MRI, mid back pain

EXAM:
CT THORACIC SPINE WITHOUT CONTRAST
TECHNIQUE: Multidetector CT images of the thoracic were obtained using the
standard protocol without intravenous contrast.

[Series 4: t spine soft · axial · 0.29mm/px · z∈[-502,-502]mm · 1 of 158 slices shown, 2 images]
[im 85/158  soft-tissue]
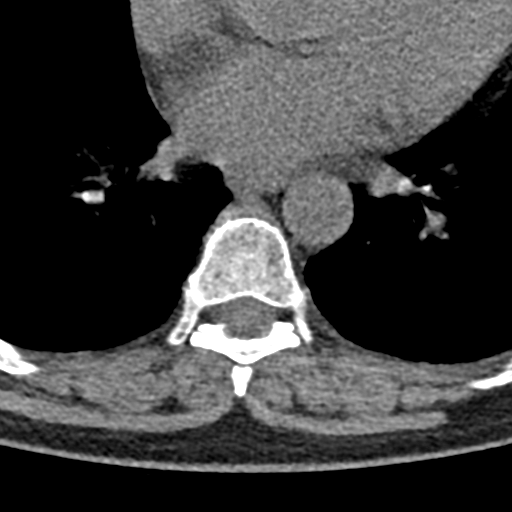
[im 85/158  bone]
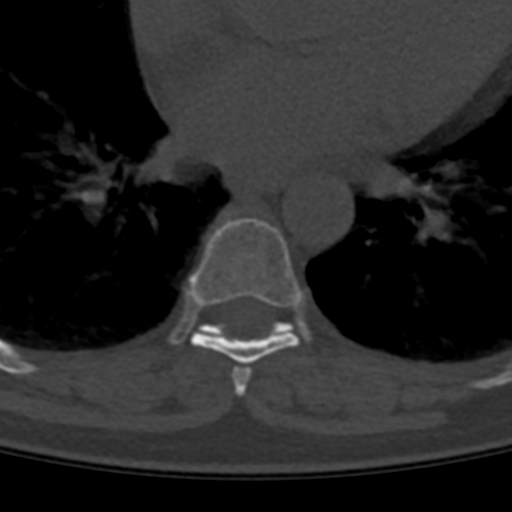

[Series 6: coronal bone · coronal · 0.23mm/px · 3 of 61 slices shown]
[im 13/61  bone]
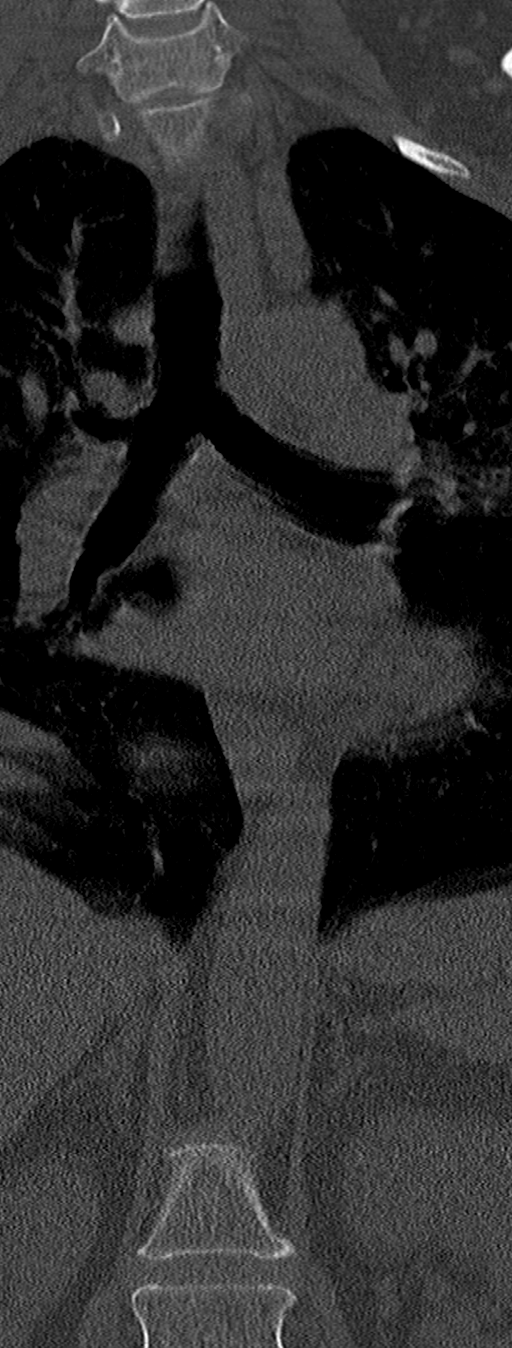
[im 25/61  bone]
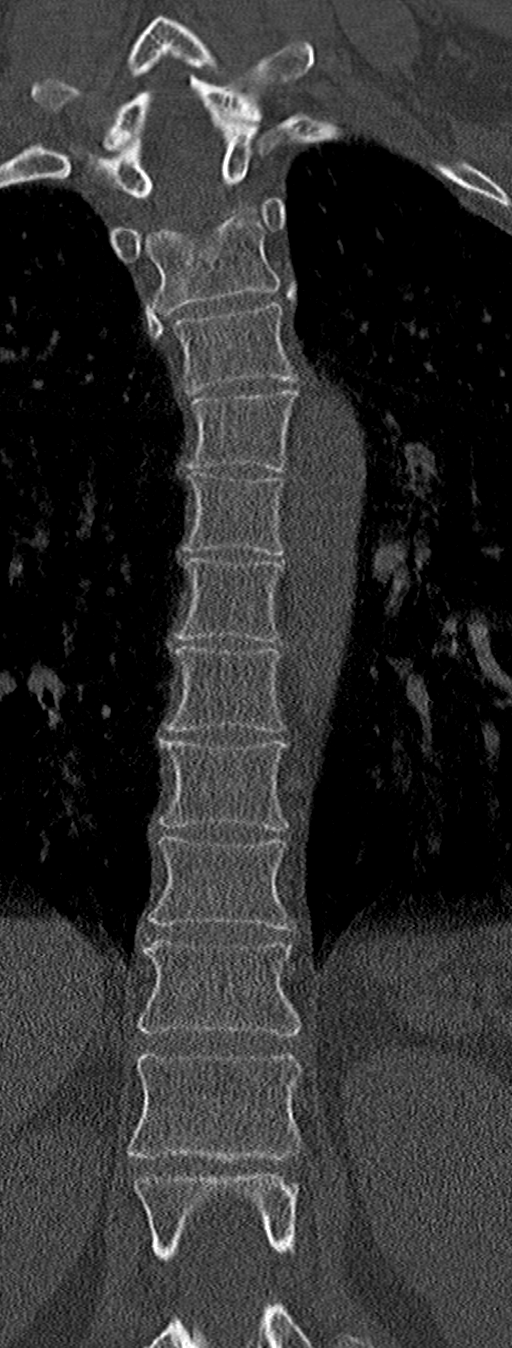
[im 37/61  bone]
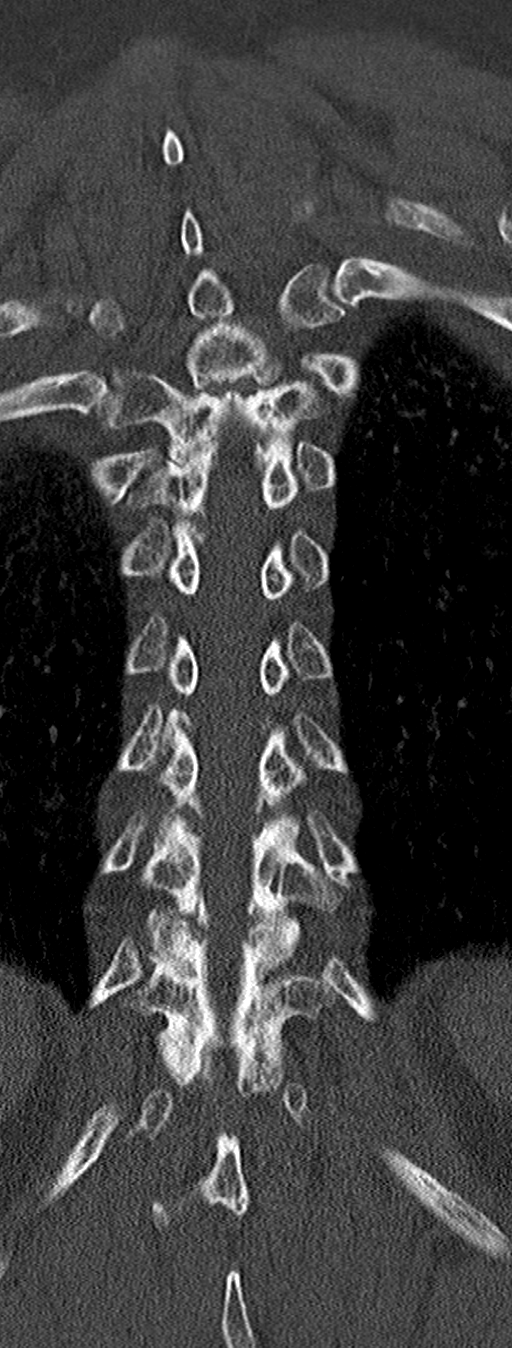

[Series 8: sagittal bone · sagittal · 0.28mm/px · 5 of 85 slices shown]
[im 13/85  bone]
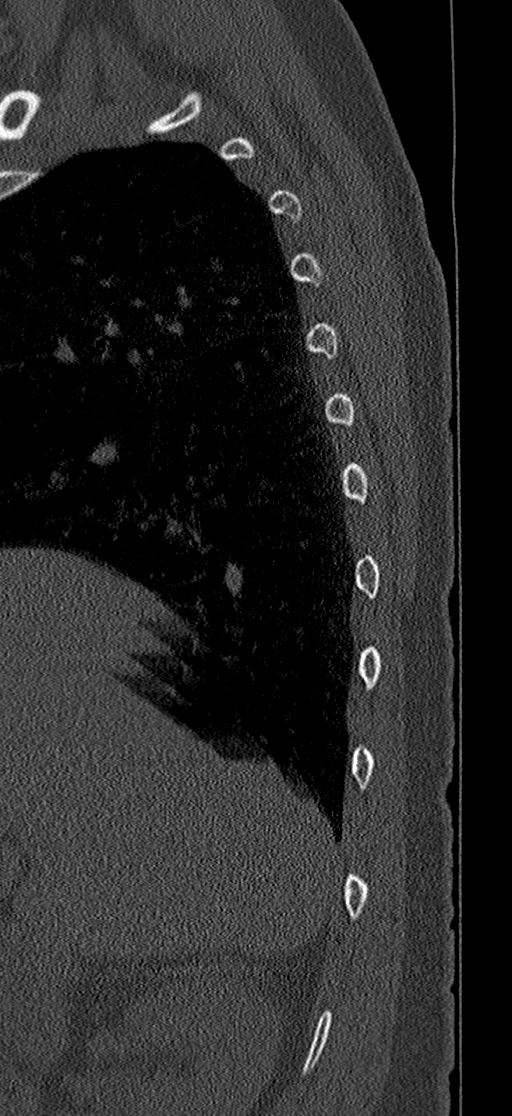
[im 25/85  bone]
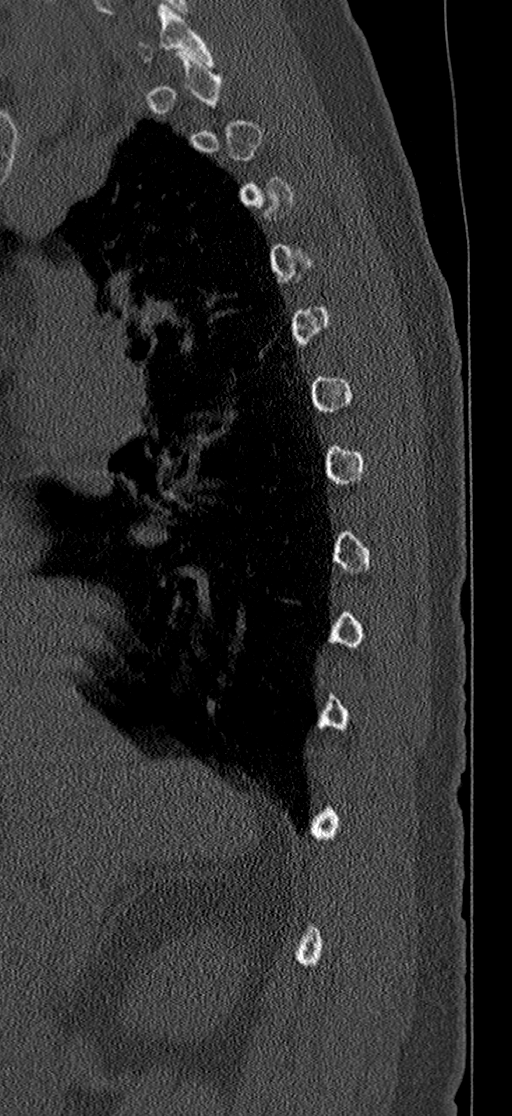
[im 37/85  bone]
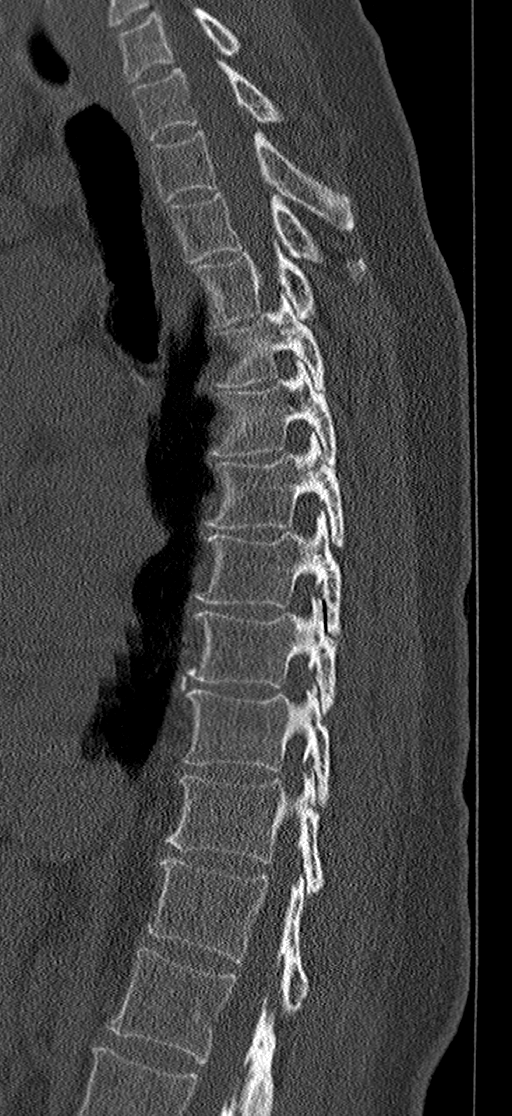
[im 49/85  bone]
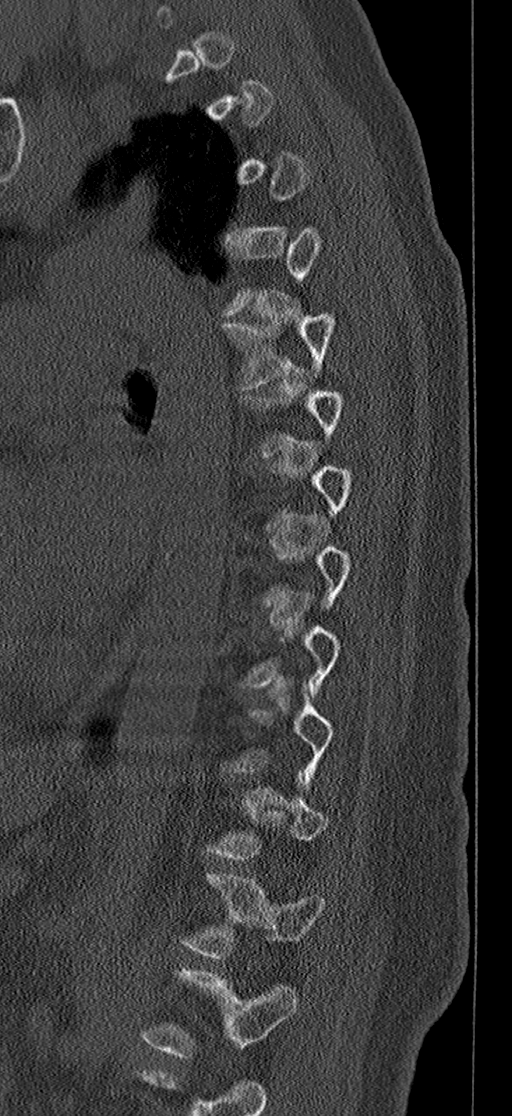
[im 61/85  bone]
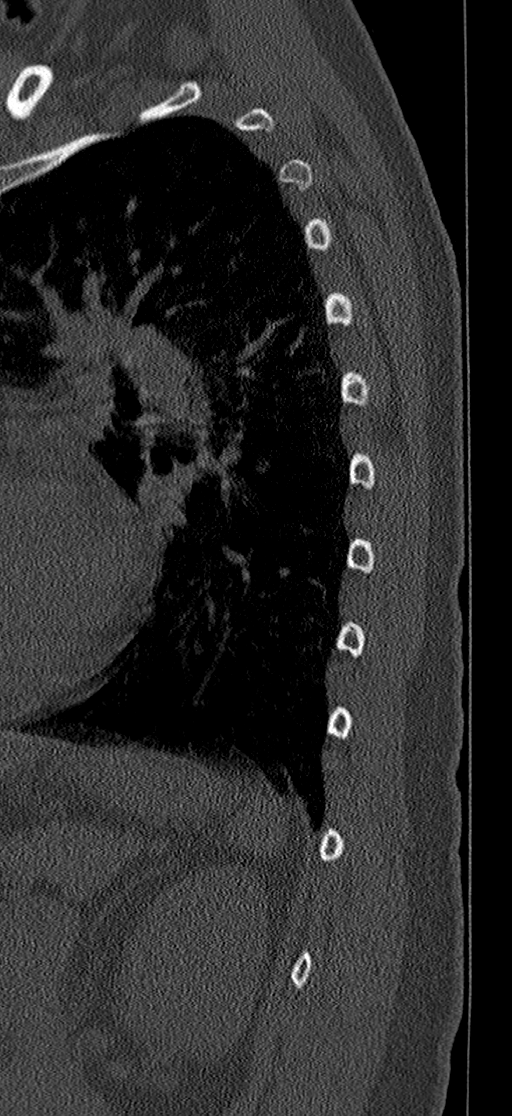

[9 of 33 positions shown; findings below may reference images not displayed]

FINDINGS: Alignment: No significant listhesis.

Vertebrae: No fracture. Vertebral body heights are maintained. No
focal bone lesions are identified.

Paraspinal and other soft tissues: Negative.

Disc levels: Minimal degenerative osteophytes at multiple levels of
the mid to lower thoracic spine. No high-grade canal stenosis.
IMPRESSION: No CT evidence for acute osseous abnormality of the thoracic spine.
No bony correlate identified for areas of edema noted on the MRI
performed earlier today.

## 2020-01-27 IMAGING — MR MR CERVICAL SPINE W/O CM
5 series · 37 of 48 positions shown · non-contrast
Comparison: No pertinent prior exams available for comparison.

CLINICAL DATA: Provided history: Right lateral neck pain with
radiation into right shoulder and down arm into hand with numbness
in arm/hand, symptoms since [REDACTED].

EXAM:
MRI CERVICAL SPINE WITHOUT CONTRAST
TECHNIQUE: Multiplanar, multisequence MR imaging of the cervical spine was
performed. No intravenous contrast was administered.

[Series 1: T2 · sagittal · 3.0mm · 0.62mm/px · 6 of 15 slices shown (1 of 2)]
[im 1/15]
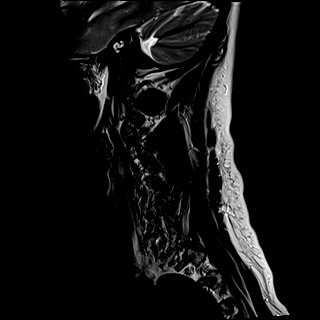
[im 3/15]
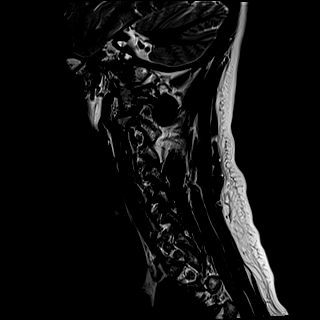
[im 6/15]
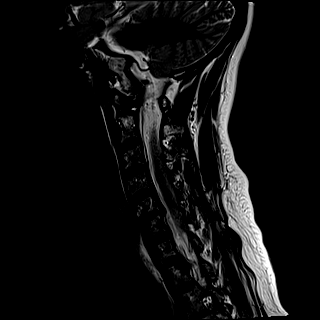
[im 9/15]
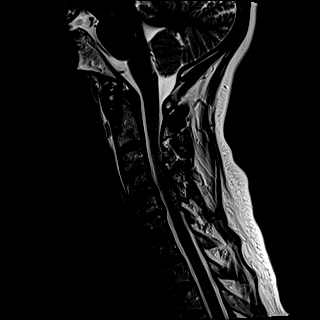
[im 12/15]
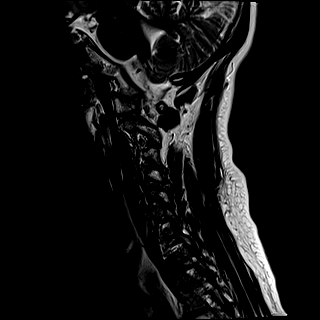
[im 15/15]
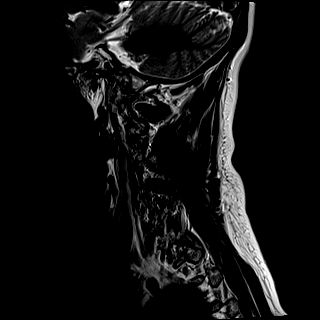

[Series 2: FLAIR · sagittal · 3.0mm · 0.78mm/px · 7 of 15 slices shown]
[im 1/15]
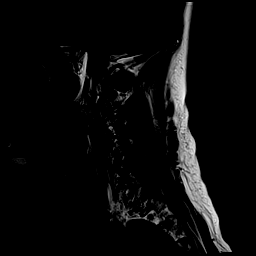
[im 3/15]
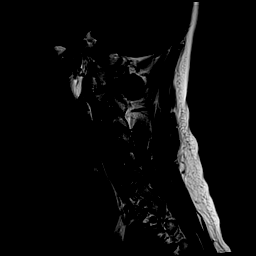
[im 5/15]
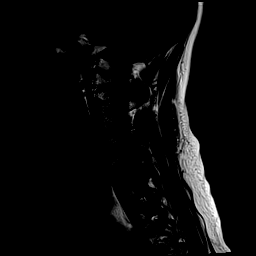
[im 8/15]
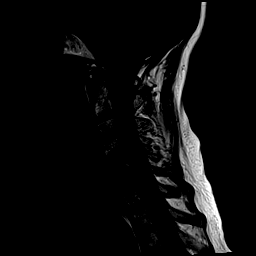
[im 10/15]
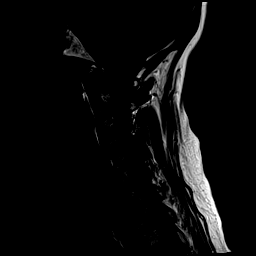
[im 12/15]
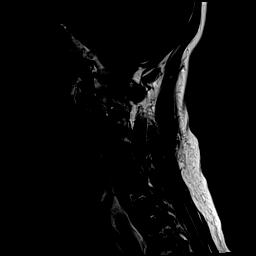
[im 15/15]
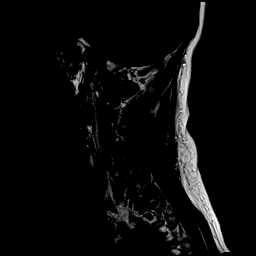

[Series 3: STIR · sagittal · 3.0mm · 0.62mm/px · 7 of 15 slices shown]
[im 1/15]
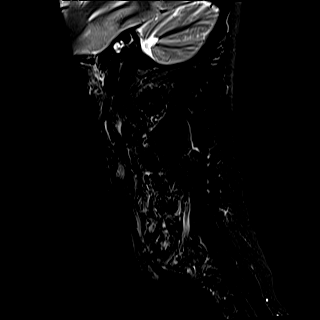
[im 3/15]
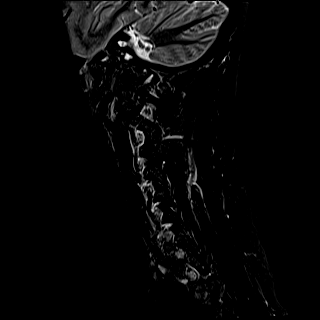
[im 5/15]
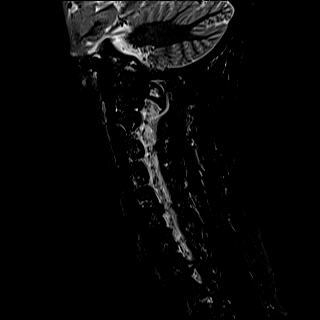
[im 8/15]
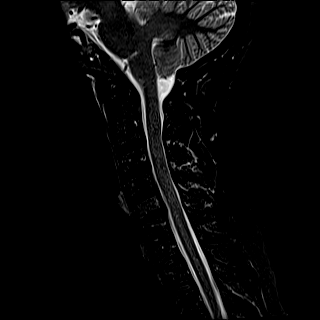
[im 10/15]
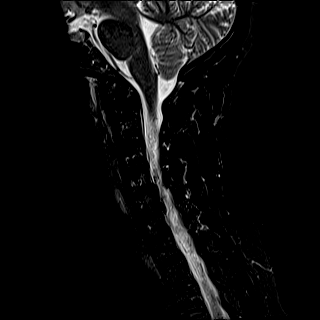
[im 12/15]
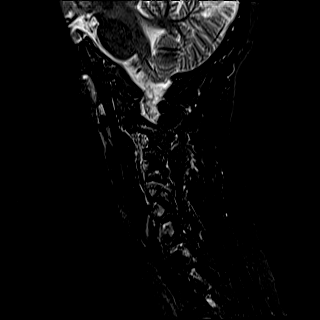
[im 15/15]
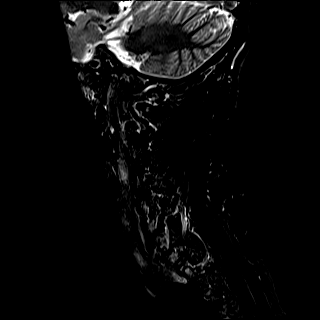

[Series 4: T2 · axial · 3.0mm · 0.70mm/px · z∈[-118,-22]mm · 9 of 29 slices shown (2 of 2)]
[im 1/29]
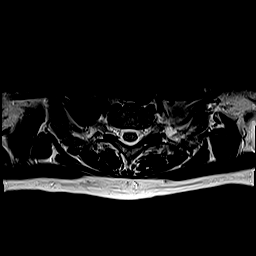
[im 3/29]
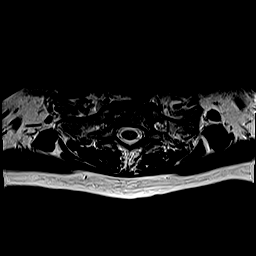
[im 5/29]
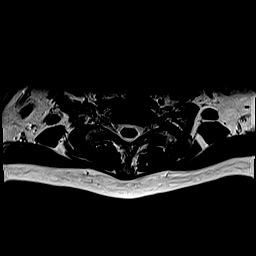
[im 9/29]
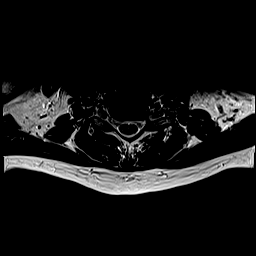
[im 13/29]
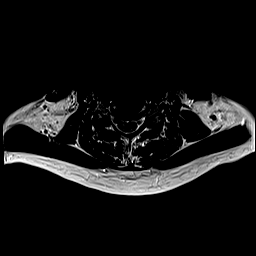
[im 16/29]
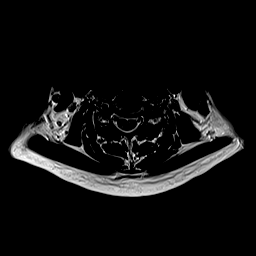
[im 20/29]
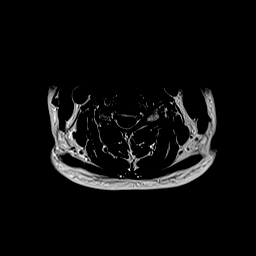
[im 24/29]
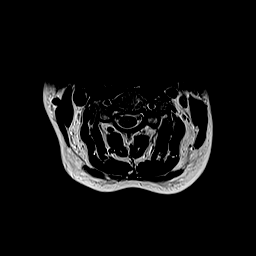
[im 29/29]
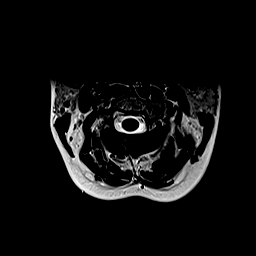

[Series 5: ax mpgr · axial · 3.0mm · 0.35mm/px · z∈[-118,-22]mm · 8 of 29 slices shown]
[im 1/29]
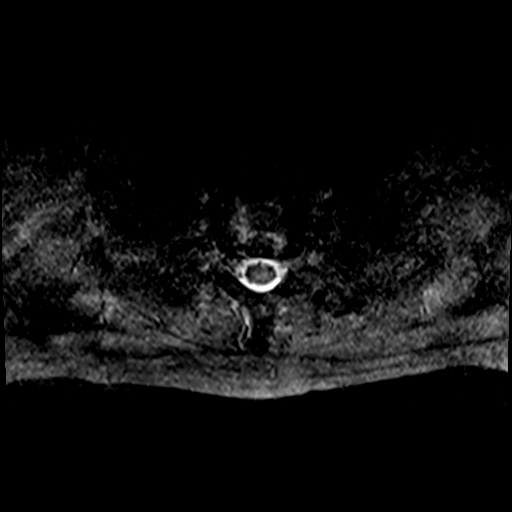
[im 5/29]
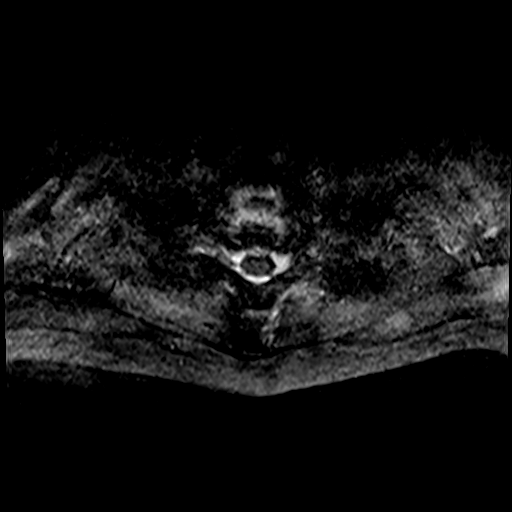
[im 9/29]
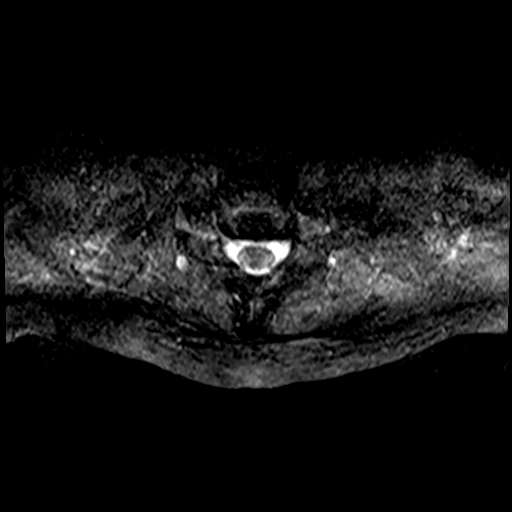
[im 13/29]
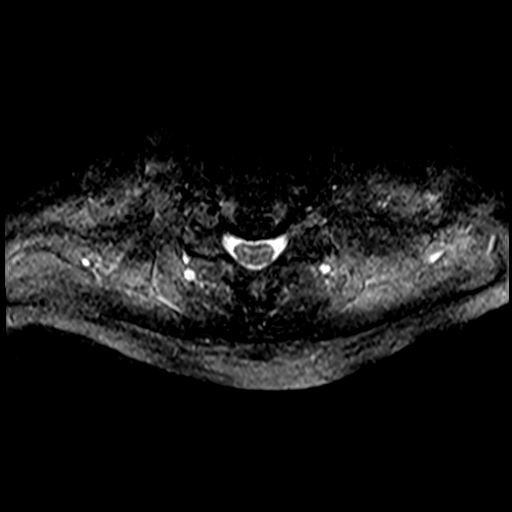
[im 16/29]
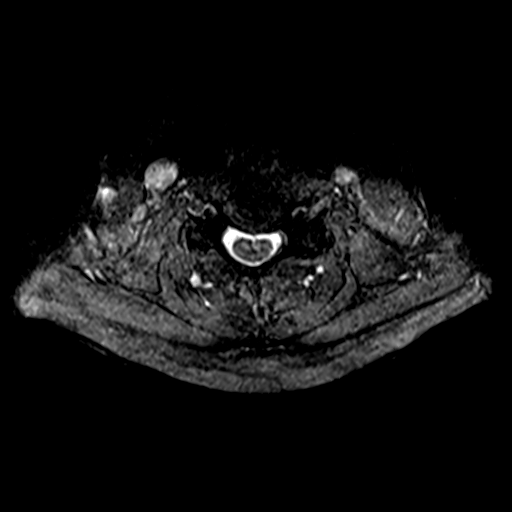
[im 20/29]
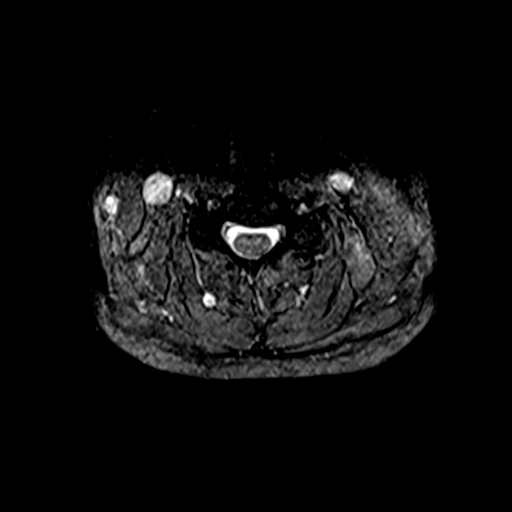
[im 24/29]
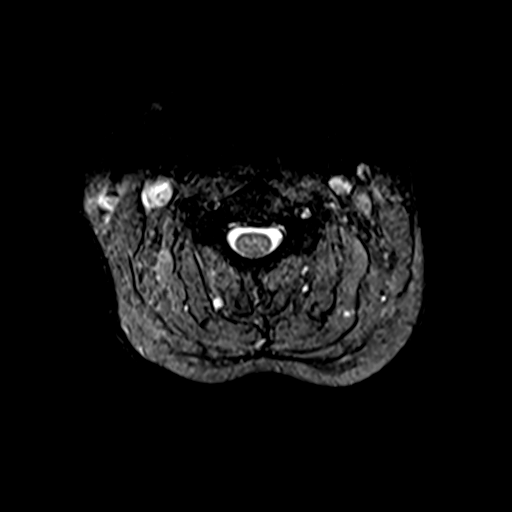
[im 29/29]
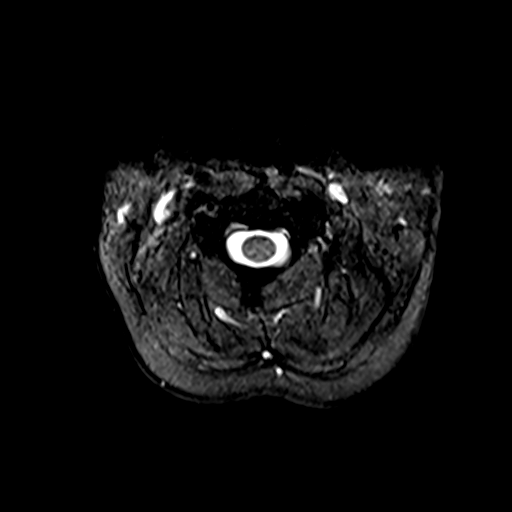

[37 of 48 positions shown; findings below may reference images not displayed]

FINDINGS: Alignment: Straightening of the expected cervical lordosis. Trace
C4-C5 and C7-T1 grade 1 anterolisthesis.

Vertebrae: Vertebral body height is maintained. Mild degenerative
endplate edema at C5-C6. Mild edema within the left C4 and C5
pedicles/articular pillars which is also likely degenerative.

Cord: No spinal cord signal abnormality is identified.

Posterior Fossa, vertebral arteries, paraspinal tissues: No
abnormality identified within included portions of the posterior
fossa. Flow voids preserved within the imaged cervical vertebral
arteries. Paraspinal soft tissues within normal limits.

Disc levels:

Moderate C5-C6 disc degeneration. Mild disc degeneration at the
remaining levels.

C2-C3: Minimal disc bulge. No significant spinal canal or foraminal
stenosis.

C3-C4: Minimal disc bulge and facet hypertrophy. No significant
spinal canal stenosis. Mild relative left neural foraminal
narrowing.

C4-C5: Grade 1 anterolisthesis. Disc uncovering with slight disc
bulge. Superimposed left center/foraminal disc protrusion. Facet and
ligamentum flavum hypertrophy (predominantly on the left). No
significant spinal canal stenosis. Moderate left neural foraminal
narrowing.

C5-C6: Disc bulge. Bilateral disc osteophyte ridge/uncinate
hypertrophy. Minimal facet hypertrophy. Mild relative spinal canal
narrowing. Mild/moderate bilateral neural foraminal narrowing.

C6-C7: No significant disc herniation or stenosis.

C7-T1: No significant disc herniation or stenosis.
IMPRESSION: Cervical spondylosis as outlined with findings most notably as
follows.

At C5-C6, there is moderate disc degeneration. Multifactorial mild
relative spinal canal narrowing and mild/moderate bilateral neural
foraminal narrowing.

At C4-C5, there is mild grade 1 anterolisthesis. A left
center/foraminal disc protrusion and facet hypertrophy contribute to
multifactorial moderate left neural foraminal narrowing.

Also of note, there is mild degenerative endplate edema at C5-C6, as
well as mild degenerative edema within the left C4 and C5
pedicles/articular pillars.

## 2020-01-27 IMAGING — MR MR THORACIC SPINE W/O CM
5 of 6 series · 28 of 48 positions shown · non-contrast
Comparison: No pertinent prior exams available for comparison.

CLINICAL DATA: Thoracic radiculitis. Myofascial pain syndrome.
Additional history provided: Patient reports mid right-sided back
pain at bra line for 1.5 months which has become severe over the
past week.

EXAM:
MRI THORACIC SPINE WITHOUT CONTRAST
TECHNIQUE: Multiplanar, multisequence MR imaging of the thoracic spine was
performed. No intravenous contrast was administered.

[Series 18: T1 · sagittal · 6.0mm · 1.41mm/px · 2 of 9 slices shown (1 of 2)]
[im 1/9]
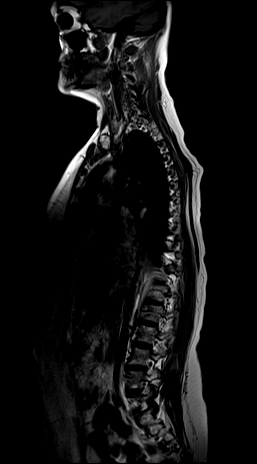
[im 9/9]
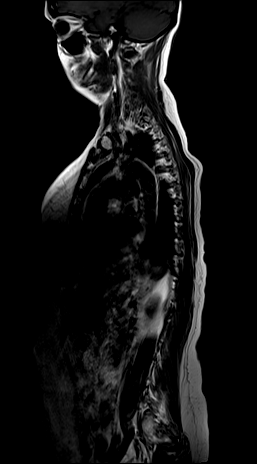

[Series 19: T2 · sagittal · 3.0mm · 1.06mm/px · 6 of 17 slices shown (1 of 2)]
[im 1/17]
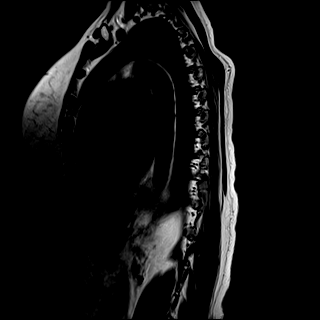
[im 4/17]
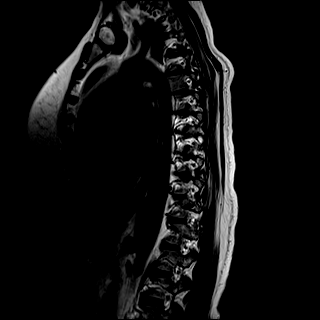
[im 7/17]
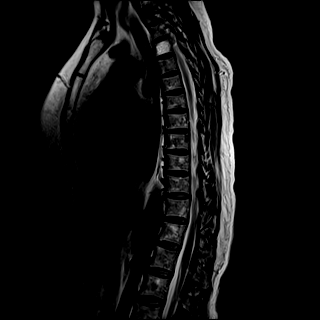
[im 10/17]
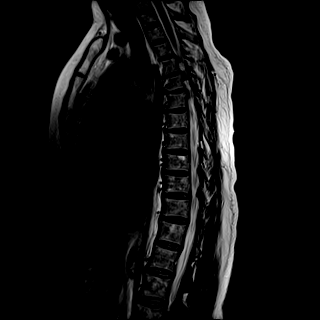
[im 13/17]
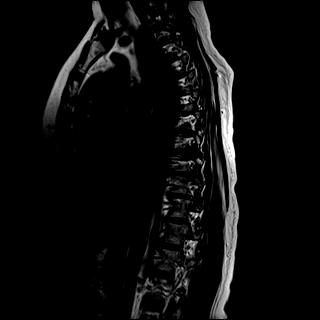
[im 17/17]
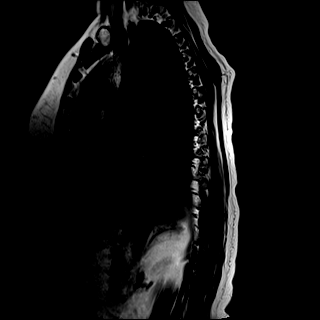

[Series 20: T1 · sagittal · 3.0mm · 1.06mm/px · 6 of 17 slices shown (2 of 2)]
[im 1/17]
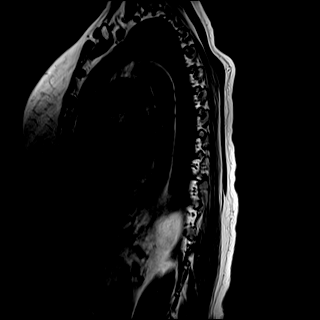
[im 4/17]
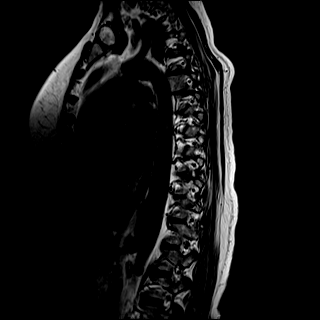
[im 7/17]
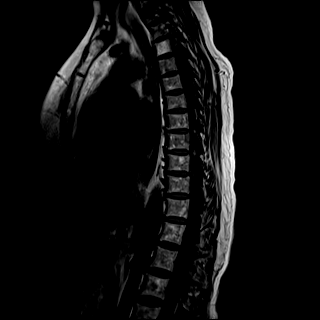
[im 10/17]
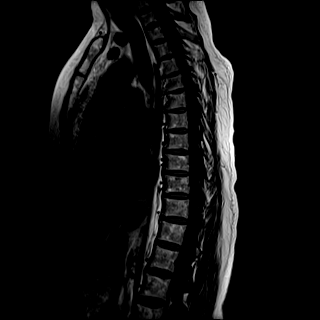
[im 13/17]
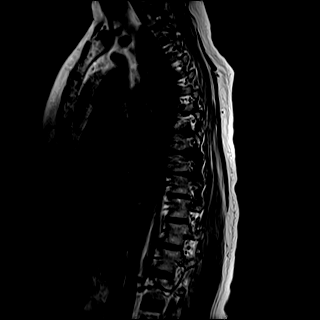
[im 17/17]
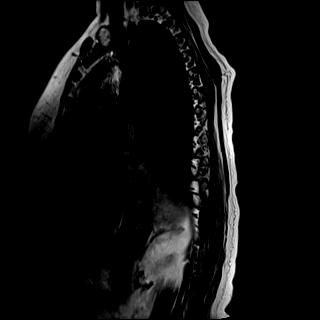

[Series 37: STIR · sagittal · 3.0mm · 0.53mm/px · 6 of 17 slices shown]
[im 1/17]
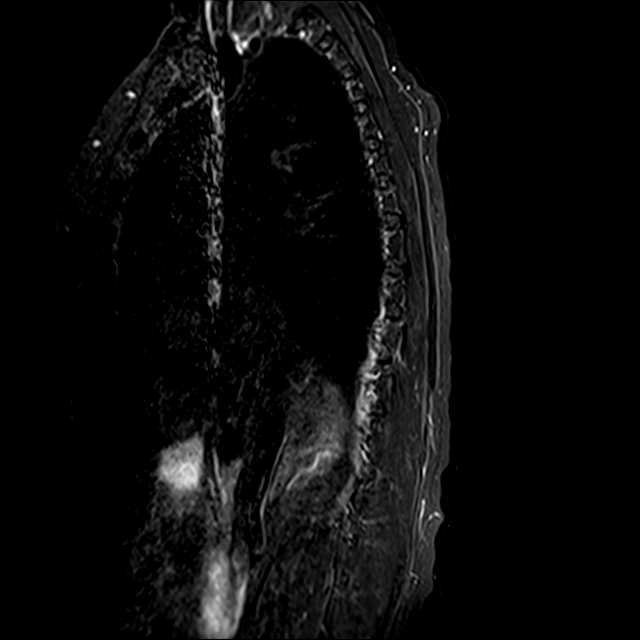
[im 4/17]
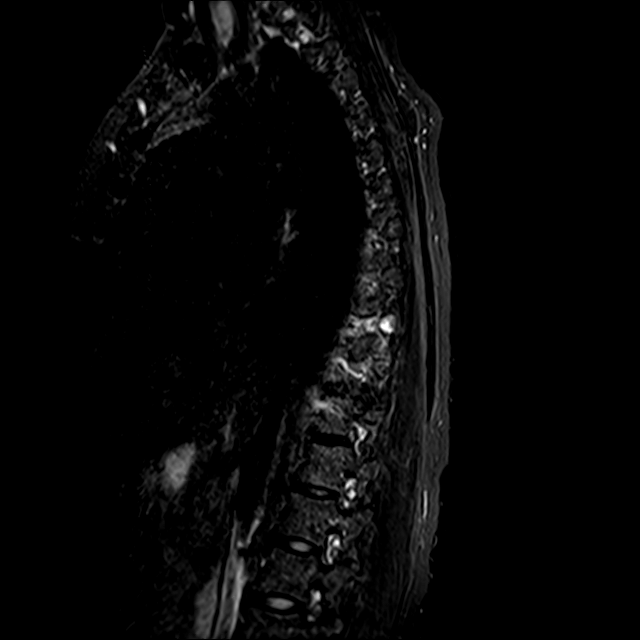
[im 7/17]
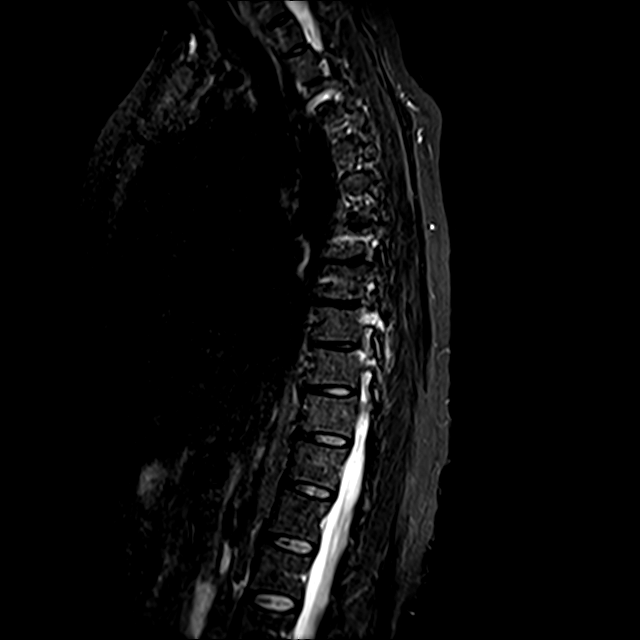
[im 10/17]
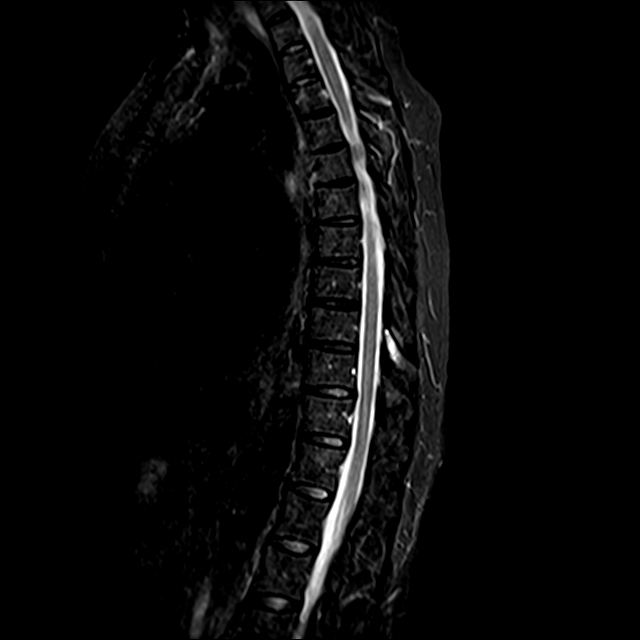
[im 13/17]
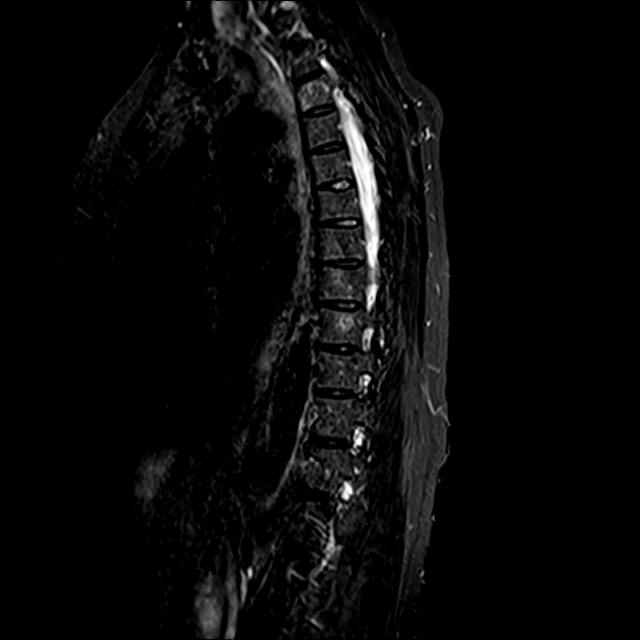
[im 17/17]
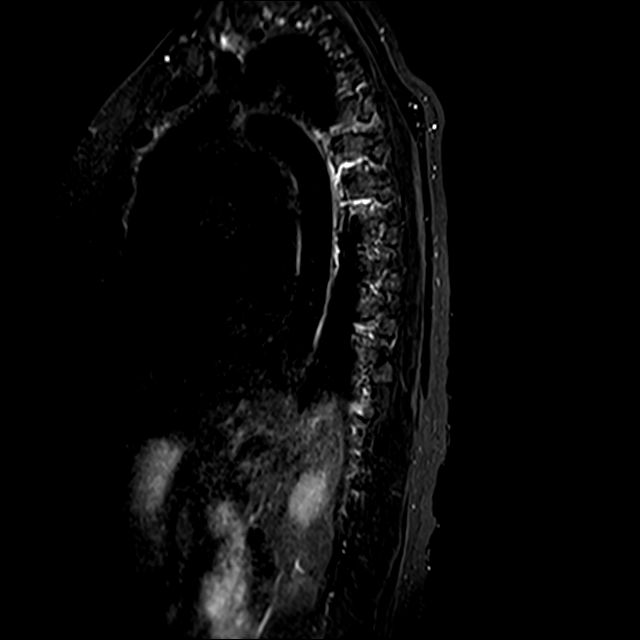

[Series 38: T2 · axial · 4.0mm · 0.59mm/px · z∈[-289,-124]mm · 8 of 39 slices shown (2 of 2)]
[im 1/39]
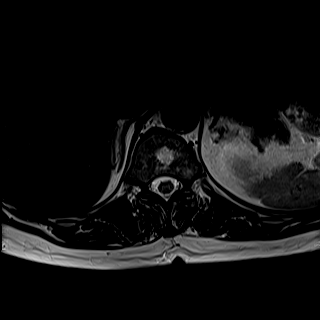
[im 6/39]
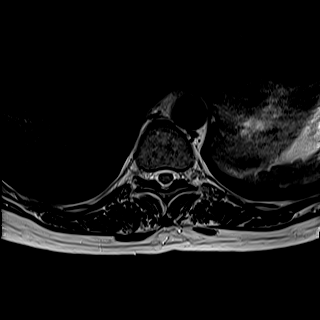
[im 12/39]
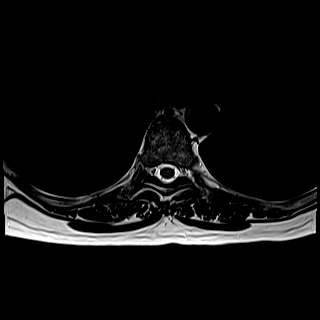
[im 18/39]
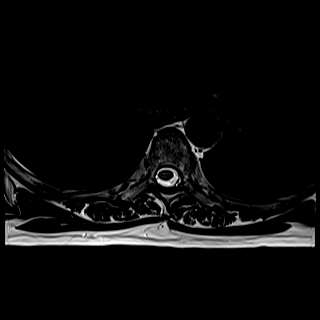
[im 21/39]
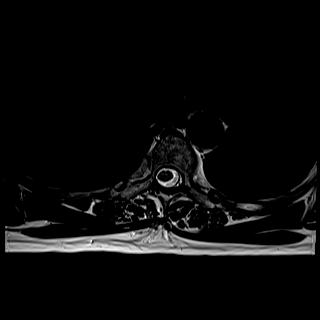
[im 27/39]
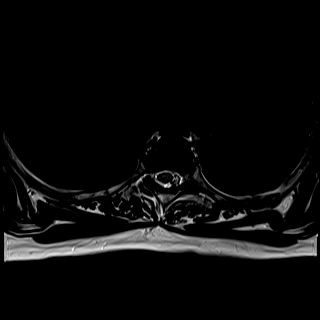
[im 33/39]
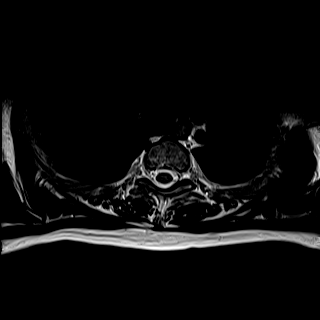
[im 39/39]
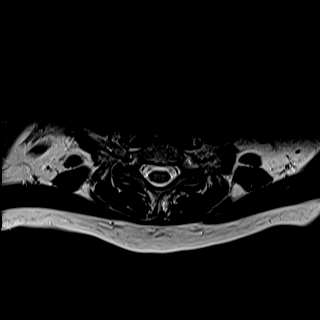

[28 of 48 positions shown; findings below may reference images not displayed]

FINDINGS: Alignment:  No significant spondylolisthesis.

Vertebrae: Vertebral body height is maintained. There is prominent
edema within the T9 spinous process and left lamina, right T9 and
T10 pedicles/articular pillars as well as right T9 transverse
process (series 37, images 3-13). Multilevel vertebral body
hemangiomas.

Cord:  No spinal cord signal abnormality is identified.

Paraspinal and other soft tissues: No abnormality identified within
included portions of the thorax or upper abdomen/retroperitoneum.

Disc levels:

No more than mild disc degeneration at any level. Minimal multilevel
disc bulges. No significant focal disc herniation or spinal canal
stenosis at any level. Mild multilevel facet arthrosis/ligamentum
flavum hypertrophy, most notably within the mid to lower thoracic
spine. No significant foraminal stenosis at any level. There are
trace right facet joint effusions at T9-T10 and T10-T11.

Impression #1 below will be called to the ordering clinician or
representative by the Radiologist Assistant, and communication
documented in the PACS or [REDACTED].
IMPRESSION: Somewhat prominent edema within the T9 spinous process and left
lamina, right T9 and T10 pedicles/articular pillars as well as right
T9 transverse process. Findings are indeterminate in etiology, but
possibly inflammatory given the presence of trace right T9-T10 and
T10-T11 facet joint effusions. MRI follow-up is recommended.

Mild thoracic spondylosis as described. No significant spinal canal
or foraminal stenosis at any level.

## 2020-02-02 ENCOUNTER — Ambulatory Visit
Admission: RE | Admit: 2020-02-02 | Discharge: 2020-02-02 | Disposition: A | Discharge status: Home / Self Care | Payer: BC Managed Care – PPO | Source: Ambulatory Visit | Attending: Neurosurgery | Admitting: Neurosurgery

## 2020-02-02 ENCOUNTER — Other Ambulatory Visit: Payer: Self-pay | Admitting: Neurosurgery

## 2020-02-02 DIAGNOSIS — M419 Scoliosis, unspecified: Secondary | ICD-10-CM

## 2020-02-02 IMAGING — CR DG SCOLIOSIS EVAL COMPLETE SPINE 2-3V
1 series · 7 of 7 positions shown · non-contrast
Comparison: None.

CLINICAL DATA: Scoliosis

EXAM:
DG SCOLIOSIS EVAL COMPLETE SPINE 2-3V

[Series 1: dg scoliosis eval complete spine 2 or 3  · 0.14mm/px · 7 of 7 slices shown]
[im 1/7]
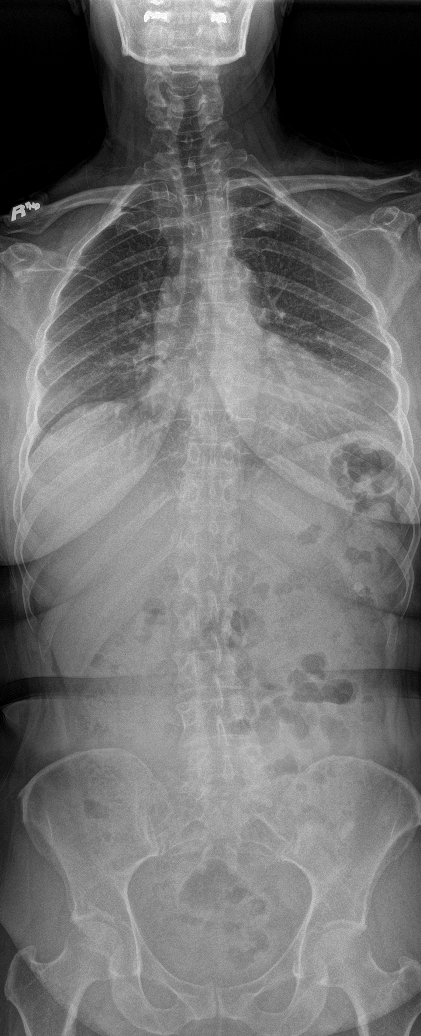
[im 2/7]
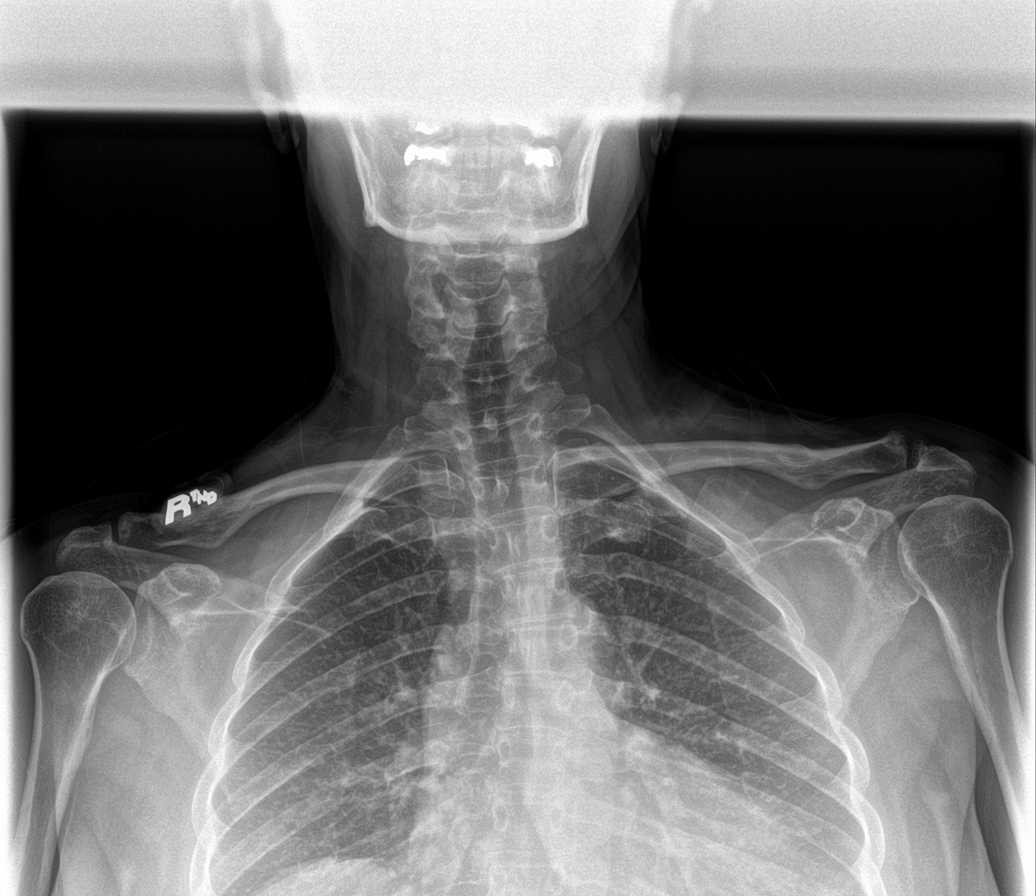
[im 3/7]
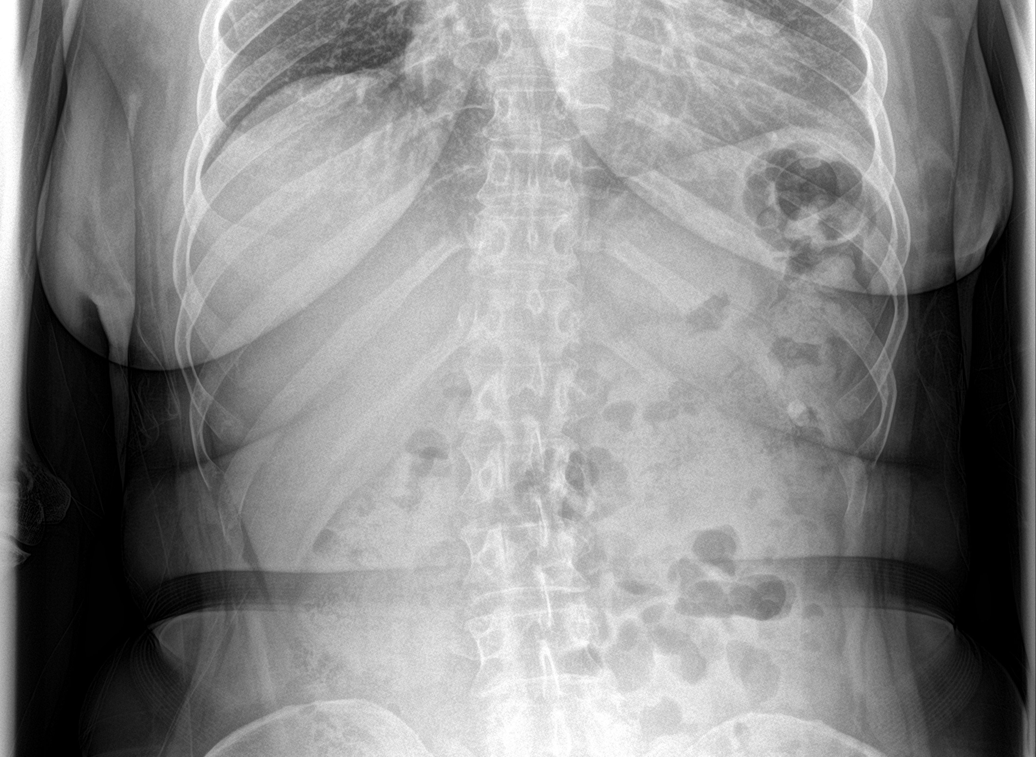
[im 4/7]
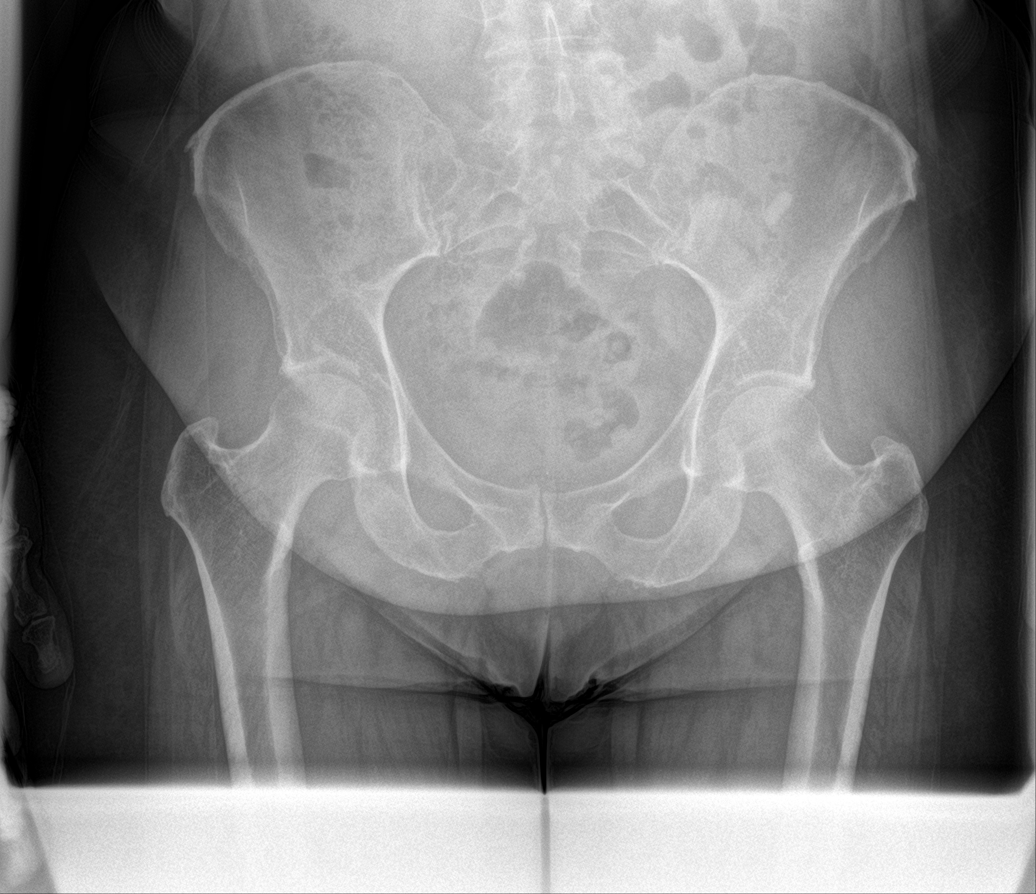
[im 5/7]
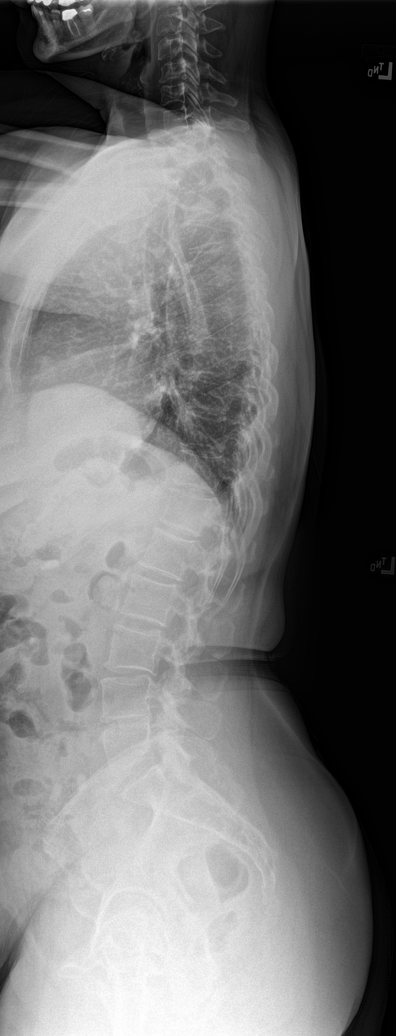
[im 6/7]
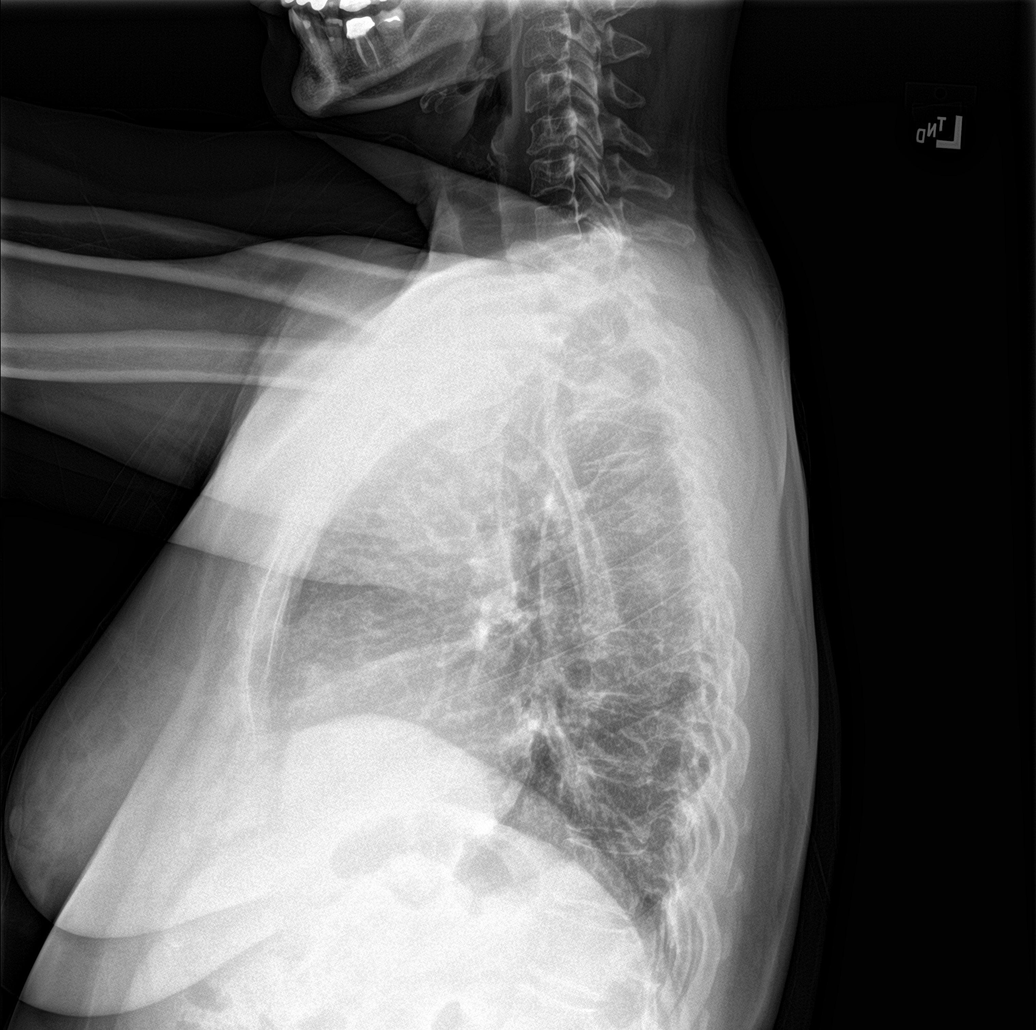
[im 7/7]
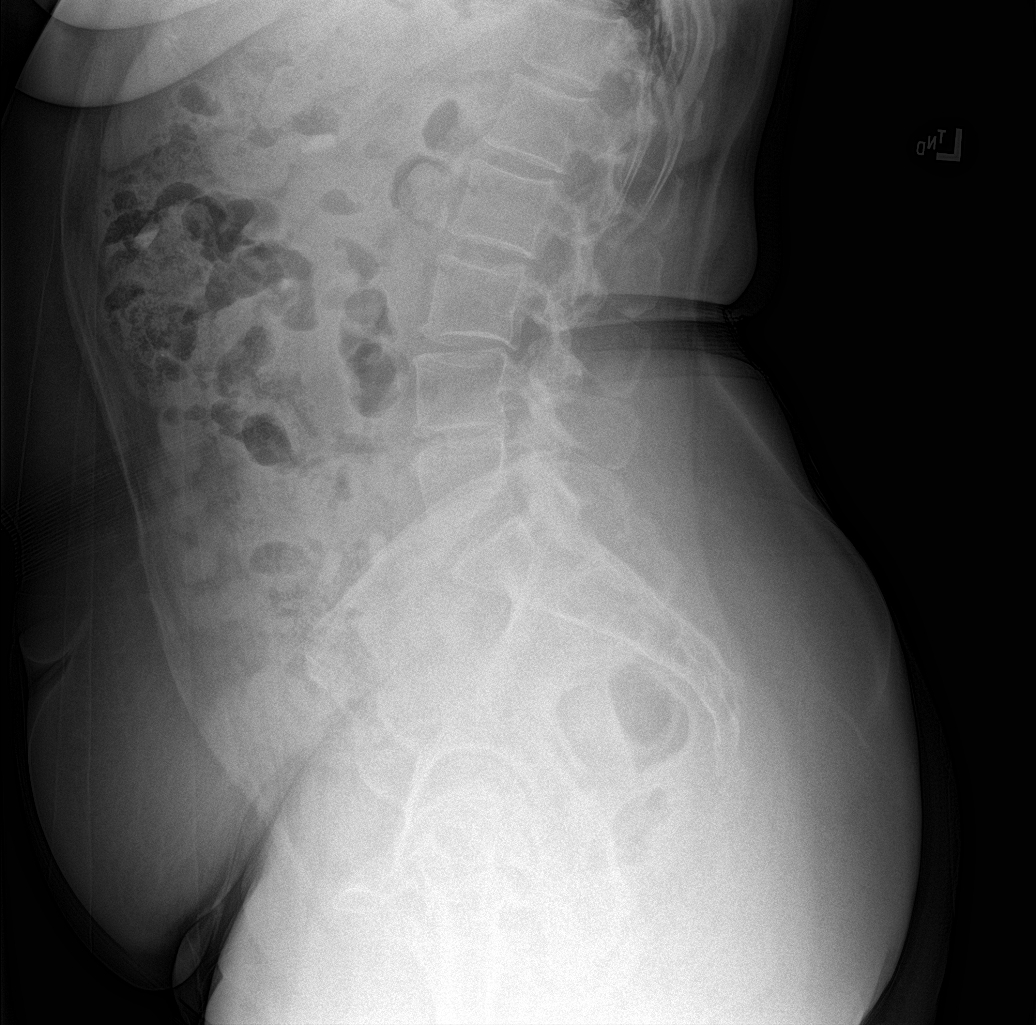

[7 of 7 positions shown; findings below may reference images not displayed]

FINDINGS: Twelve rib-bearing segments of the thoracic spine. Five non rib
bearing segments of the lumbar spine. No vertebral anomaly.

There is mild sigmoid scoliosis of the thoracolumbar spine, apex
left at T5 of approximately 14 degrees and apex right at L2 of
approximately 9 degrees.

There is normal thoracic kyphosis. There is accentuated lumbar
lordosis with the thoracolumbar junction approximately 3 cm
posterior to the lumbosacral junction.

No acute fracture or listhesis of the thoracolumbar spine. Vertebral
body height and intervertebral disc height has been preserved. The
paraspinal soft tissues are unremarkable.

There is mild pelvic tilt with the right acetabular roof
approximately 6 mm higher than the left acetabular roof in keeping
with limb length discrepancy
IMPRESSION: Mild sigmoid scoliosis and accentuated lumbar lordosis as described
above.

Mild pelvic tilt suggesting underlying limb length discrepancy.

## 2020-10-12 ENCOUNTER — Other Ambulatory Visit: Payer: Self-pay | Admitting: Physical Medicine & Rehabilitation

## 2020-10-12 DIAGNOSIS — M5412 Radiculopathy, cervical region: Secondary | ICD-10-CM

## 2020-10-25 ENCOUNTER — Other Ambulatory Visit: Payer: BC Managed Care – PPO

## 2020-11-02 ENCOUNTER — Ambulatory Visit
Admission: RE | Admit: 2020-11-02 | Discharge: 2020-11-02 | Disposition: A | Discharge status: Home / Self Care | Payer: BC Managed Care – PPO | Source: Ambulatory Visit | Attending: Physical Medicine & Rehabilitation | Admitting: Physical Medicine & Rehabilitation

## 2020-11-02 DIAGNOSIS — M5412 Radiculopathy, cervical region: Secondary | ICD-10-CM

## 2020-11-02 IMAGING — XA DG INJECT/[PERSON_NAME] INC NEEDLE/CATH/PLC EPI/CERV/THOR W/IMG
2 series · 2 of 2 positions shown · non-contrast
Comparison: none

CLINICAL DATA: Cervical spondylosis without myelopathy. Pain in the
neck and arms, right greater than left. Partial improvement
following multiple prior cervical epidural injections.

[Series 1: ortho adipose · 1 of 1 slices shown (1 of 2)]
[im 1/1]
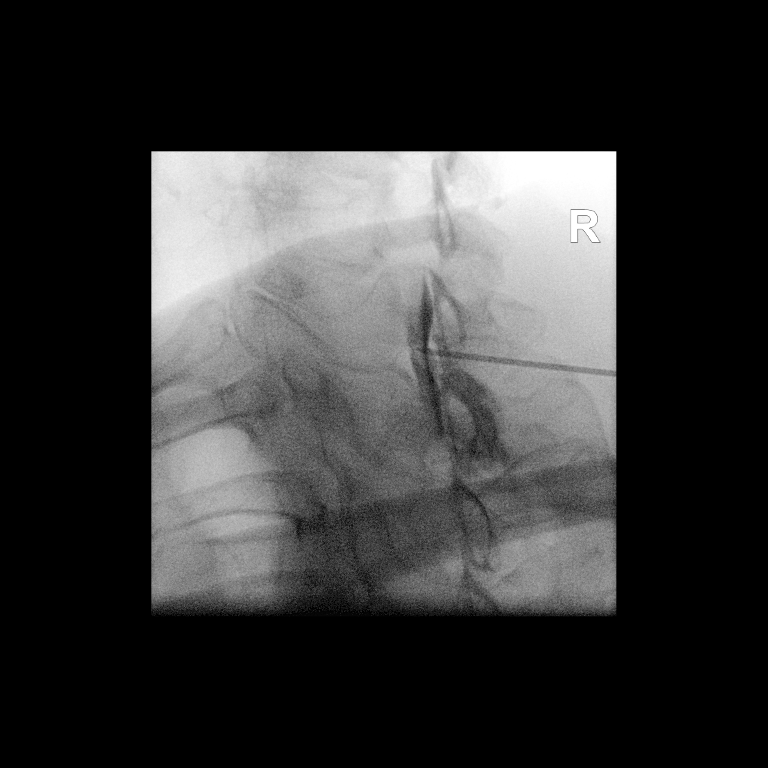

[Series 2: ortho adipose · 1 of 1 slices shown (2 of 2)]
[im 1/1]
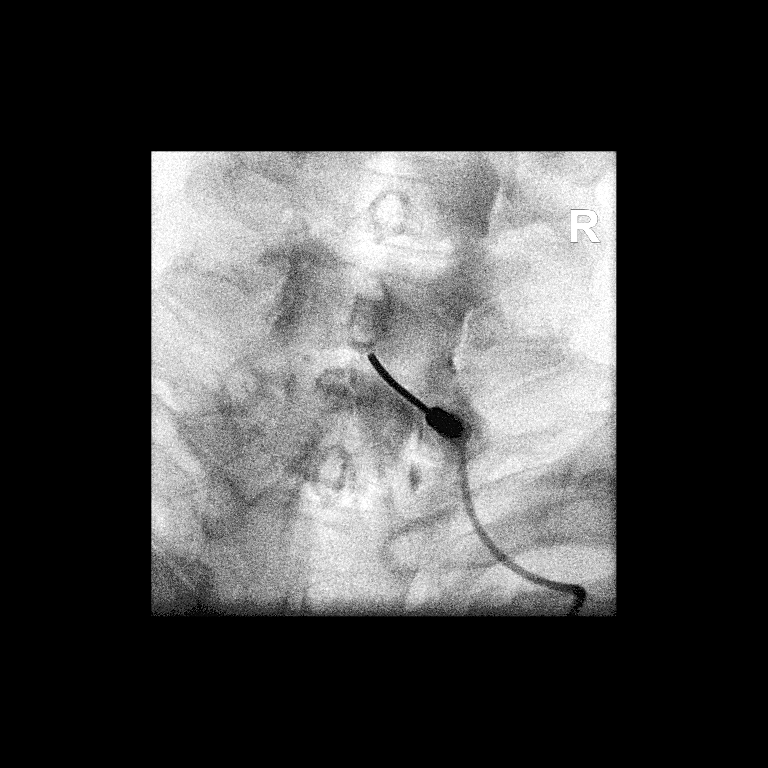

[2 of 2 positions shown; findings below may reference images not displayed]

FLUOROSCOPY TIME:  Fluoroscopy Time: 30 seconds

Radiation Exposure Index: 10.54 microGray*m^2

PROCEDURE:
The procedure, risks, benefits, and alternatives were explained to
the patient. Questions regarding the procedure were encouraged and
answered. The patient understands and consents to the procedure.

CERVICAL EPIDURAL INJECTION

An interlaminar approach was performed on the right at C7-T1. A
inch 20 gauge epidural needle was advanced using loss-of-resistance
technique.

DIAGNOSTIC EPIDURAL INJECTION

Injection of Isovue-M 300 shows a good epidural pattern with spread
above and below the level of needle placement, primarily on the
right. No vascular opacification is seen.

THERAPEUTIC EPIDURAL INJECTION

1.5 ml of Kenalog 40 mixed with 2 ml of normal saline were then
instilled. The procedure was well-tolerated, and the patient was
discharged thirty minutes following the injection in good condition.
IMPRESSION: Technically successful interlaminar epidural injection on the right
at C7-T1.

## 2020-11-02 MED ORDER — IOPAMIDOL (ISOVUE-M 300) INJECTION 61%
1.0000 mL | Freq: Once | INTRAMUSCULAR | Status: AC
  Administered 2020-11-02: 1 mL via EPIDURAL

## 2020-11-02 MED ORDER — TRIAMCINOLONE ACETONIDE 40 MG/ML IJ SUSP (RADIOLOGY)
60.0000 mg | Freq: Once | INTRAMUSCULAR | Status: AC
  Administered 2020-11-02: 60 mg via EPIDURAL

## 2020-11-02 NOTE — Discharge Instructions (Signed)

## 2021-03-15 ENCOUNTER — Other Ambulatory Visit (HOSPITAL_COMMUNITY): Payer: Self-pay | Admitting: Physical Medicine & Rehabilitation

## 2021-03-15 ENCOUNTER — Other Ambulatory Visit: Payer: Self-pay | Admitting: Physical Medicine & Rehabilitation

## 2021-03-15 DIAGNOSIS — M5416 Radiculopathy, lumbar region: Secondary | ICD-10-CM

## 2021-03-25 ENCOUNTER — Ambulatory Visit
Admission: RE | Admit: 2021-03-25 | Discharge: 2021-03-25 | Disposition: A | Discharge status: Home / Self Care | Payer: BC Managed Care – PPO | Source: Ambulatory Visit | Attending: Physical Medicine & Rehabilitation | Admitting: Physical Medicine & Rehabilitation

## 2021-03-25 ENCOUNTER — Other Ambulatory Visit: Payer: Self-pay

## 2021-03-25 DIAGNOSIS — M5416 Radiculopathy, lumbar region: Secondary | ICD-10-CM | POA: Insufficient documentation

## 2021-03-25 IMAGING — MR MR LUMBAR SPINE W/O CM
5 series · 31 of 48 positions shown · non-contrast
Comparison: [DATE]

CLINICAL DATA: Low back pain with right leg pain and numbness. No
known injury.

EXAM:
MRI LUMBAR SPINE WITHOUT CONTRAST
TECHNIQUE: Multiplanar, multisequence MR imaging of the lumbar spine was
performed. No intravenous contrast was administered.

[Series 5: T2 · sagittal · 4.0mm · 0.81mm/px · 6 of 17 slices shown (1 of 2)]
[im 1/17]
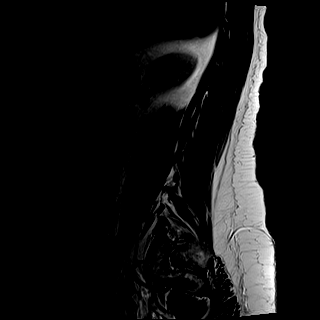
[im 4/17]
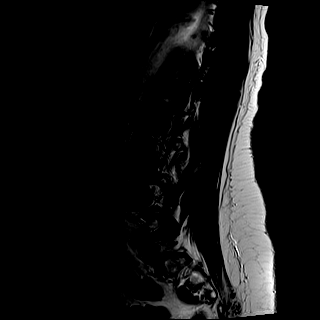
[im 7/17]
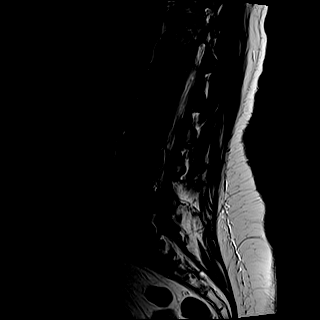
[im 10/17]
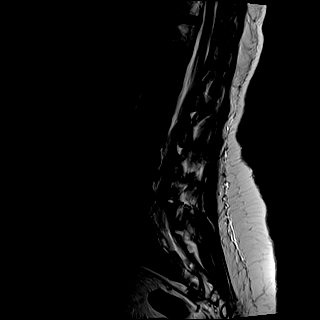
[im 13/17]
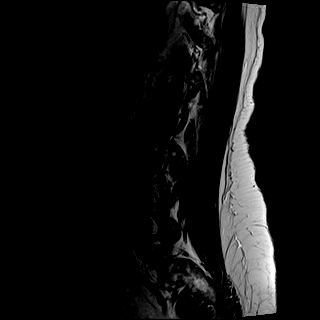
[im 17/17]
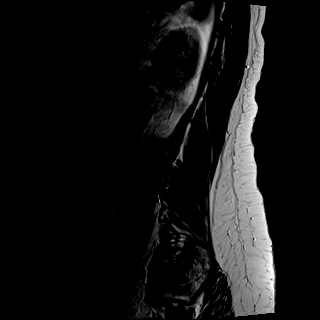

[Series 6: T1 · sagittal · 4.0mm · 0.81mm/px · 7 of 17 slices shown (1 of 2)]
[im 1/17]
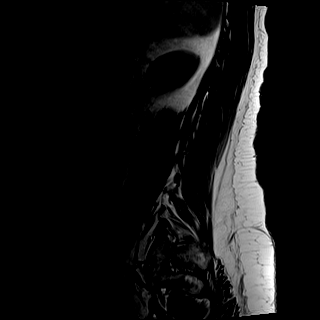
[im 3/17]
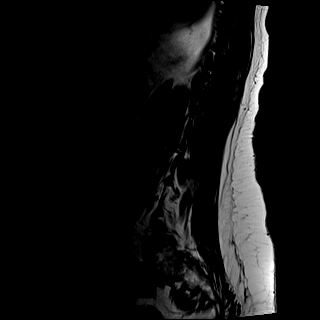
[im 6/17]
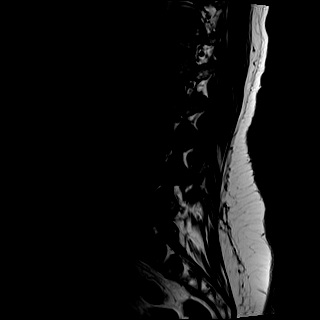
[im 9/17]
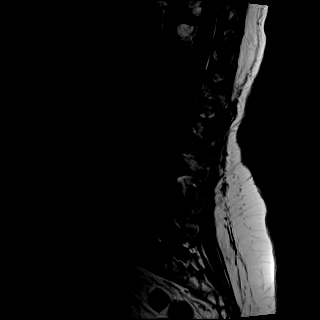
[im 11/17]
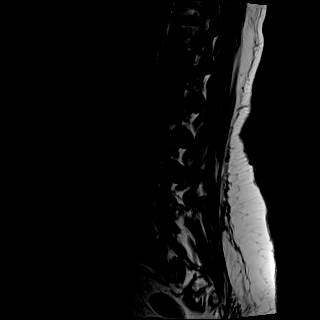
[im 14/17]
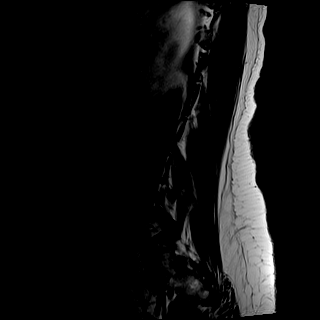
[im 17/17]
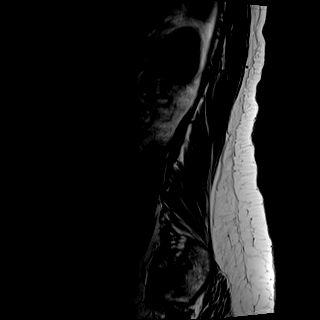

[Series 7: STIR · sagittal · 4.0mm · 0.41mm/px · 2 of 17 slices shown]
[im 1/17]
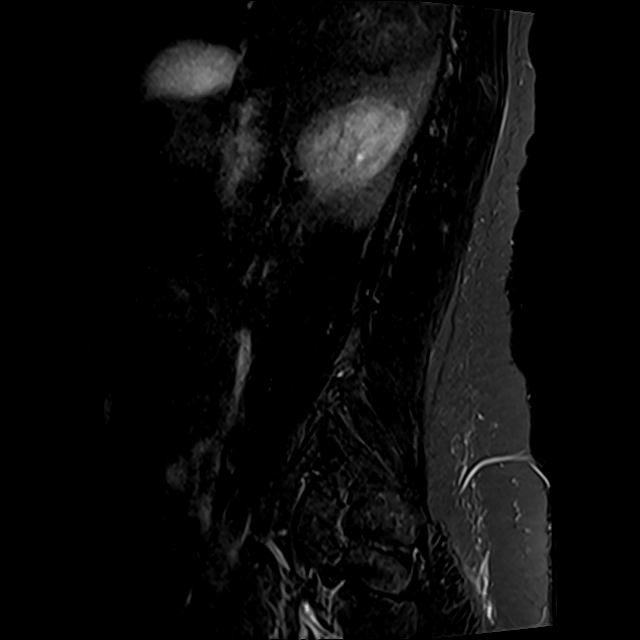
[im 3/17]
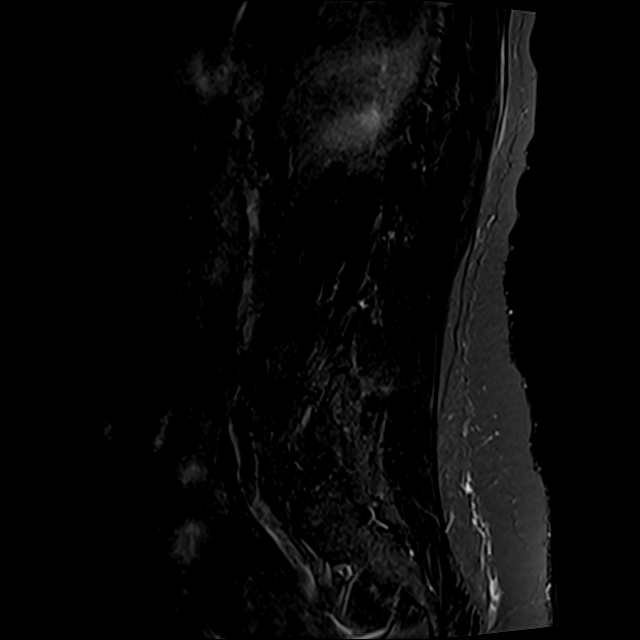

[Series 8: T2 · axial · 4.0mm · 0.78mm/px · z∈[-200,+14]mm · 8 of 36 slices shown (2 of 2)]
[im 1/36]
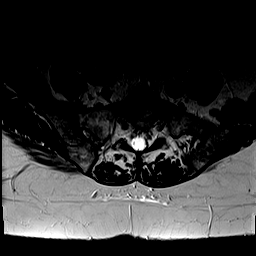
[im 6/36]
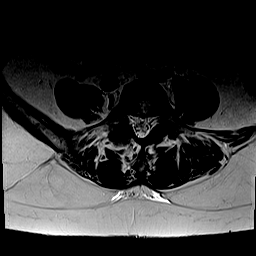
[im 11/36]
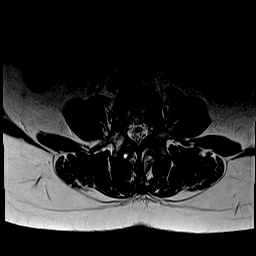
[im 17/36]
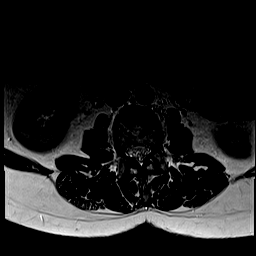
[im 19/36]
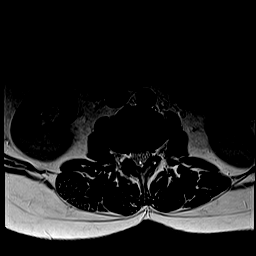
[im 25/36]
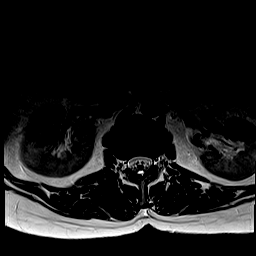
[im 30/36]
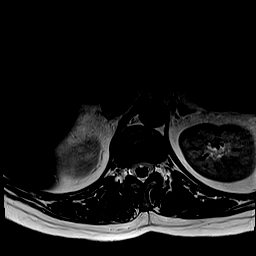
[im 36/36]
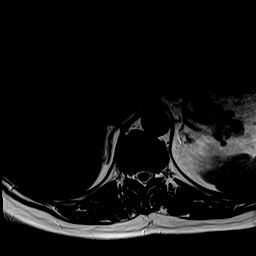

[Series 9: T1 · axial · 4.0mm · 0.39mm/px · z∈[-200,+14]mm · 8 of 36 slices shown (2 of 2)]
[im 1/36]
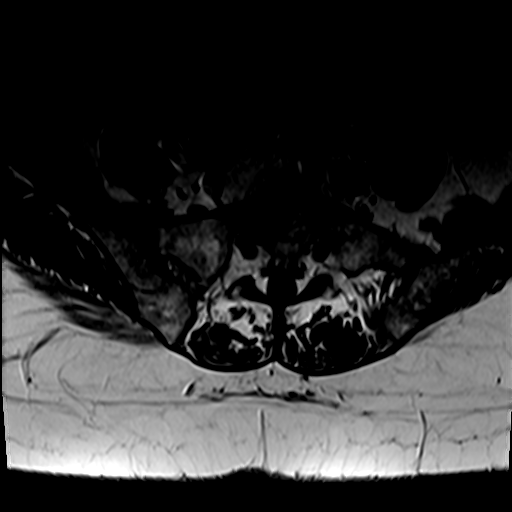
[im 6/36]
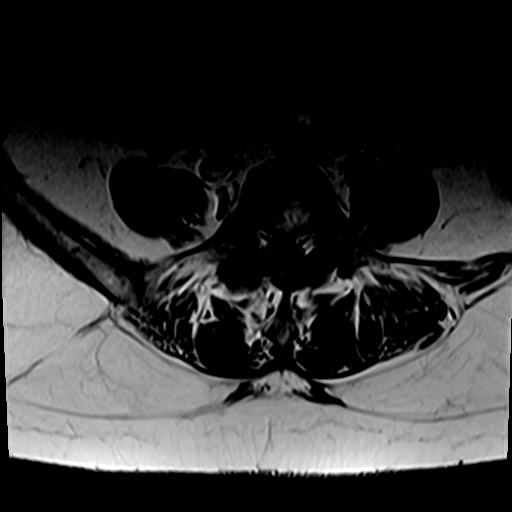
[im 11/36]
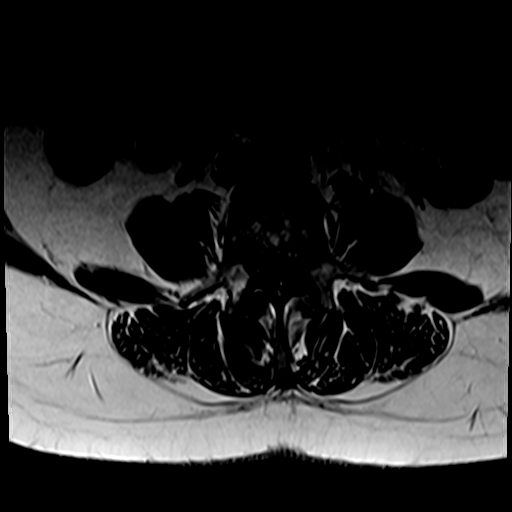
[im 17/36]
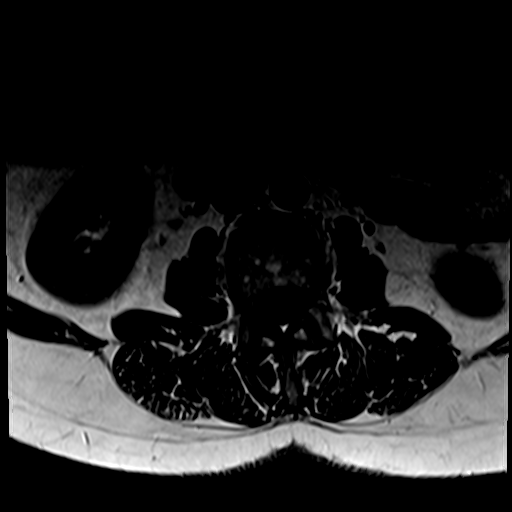
[im 19/36]
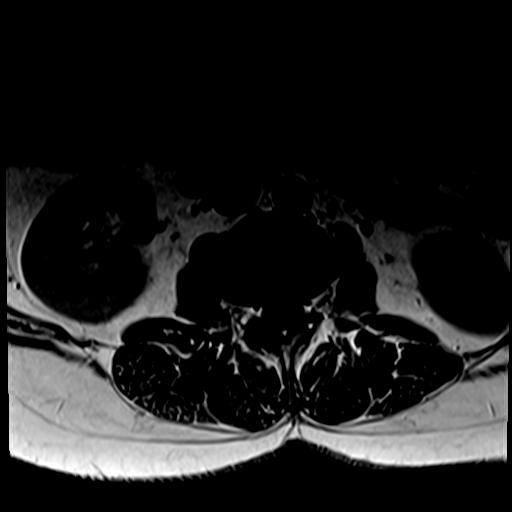
[im 25/36]
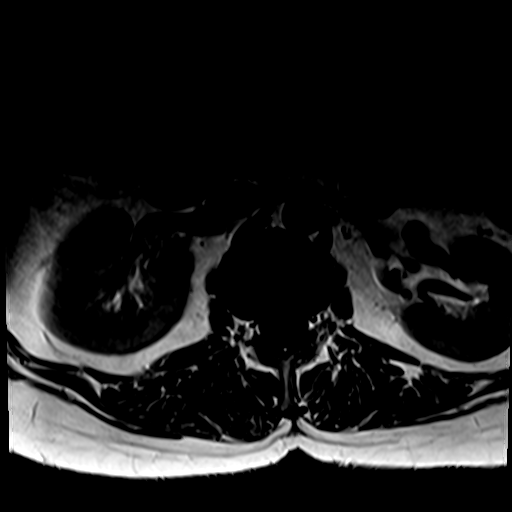
[im 30/36]
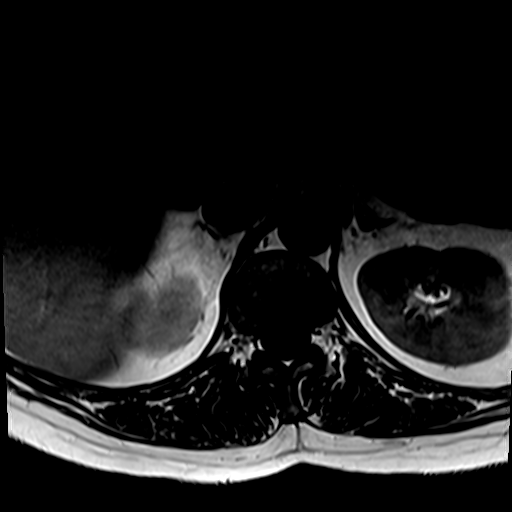
[im 36/36]
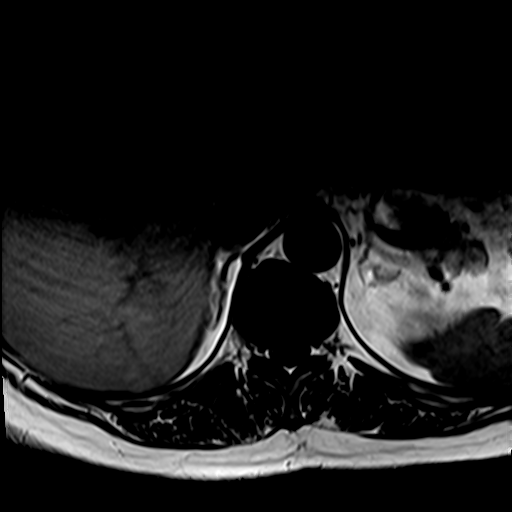

[31 of 48 positions shown; findings below may reference images not displayed]

FINDINGS: Segmentation:  5 lumbar type vertebrae

Alignment:  Mild dextrocurvature.  Borderline L4-5 retrolisthesis.

Vertebrae:  No fracture, evidence of discitis, or bone lesion.

Conus medullaris and cauda equina: Conus extends to the L2 level.
Conus and cauda equina appear normal.

Paraspinal and other soft tissues: Negative for perispinal mass or
inflammation

Disc levels:

T12- L1: Unremarkable.

L1-L2: Unremarkable.

L2-L3: Early right facet spurring.

L3-L4: Mild disc bulging and facet spurring.

L4-L5: Disc bulging especially into the right more than left
inferior foramina. Mild bilateral facet spurring. Mild to moderate
spinal stenosis asymmetrically narrowing the right subarticular
recess and foramen.

L5-S1:Small central disc protrusion.  Mild facet spurring.
IMPRESSION: 1. Stable compared to [CJ].
2. Lower lumbar degeneration with mild scoliosis.
3. Mild to moderate degenerative spinal stenosis at L4-5 with
asymmetric effacement of the right subarticular recess.

## 2021-06-24 ENCOUNTER — Encounter (HOSPITAL_BASED_OUTPATIENT_CLINIC_OR_DEPARTMENT_OTHER): Payer: Self-pay | Admitting: Otolaryngology

## 2021-06-24 ENCOUNTER — Other Ambulatory Visit: Payer: Self-pay

## 2021-06-25 ENCOUNTER — Encounter (HOSPITAL_BASED_OUTPATIENT_CLINIC_OR_DEPARTMENT_OTHER)
Admission: RE | Admit: 2021-06-25 | Discharge: 2021-06-25 | Disposition: A | Discharge status: Home / Self Care | Payer: BC Managed Care – PPO | Source: Ambulatory Visit | Attending: Otolaryngology | Admitting: Otolaryngology

## 2021-06-25 DIAGNOSIS — Z01818 Encounter for other preprocedural examination: Secondary | ICD-10-CM | POA: Diagnosis present

## 2021-06-25 DIAGNOSIS — I10 Essential (primary) hypertension: Secondary | ICD-10-CM | POA: Diagnosis not present

## 2021-06-25 LAB — BASIC METABOLIC PANEL
Anion gap: 8 (ref 5–15)
BUN: 7 mg/dL (ref 6–20)
CO2: 26 mmol/L (ref 22–32)
Calcium: 9.4 mg/dL (ref 8.9–10.3)
Chloride: 106 mmol/L (ref 98–111)
Creatinine, Ser: 0.59 mg/dL (ref 0.44–1.00)
GFR, Estimated: >60 mL/min (ref >60)
Glucose, Bld: 84 mg/dL (ref 70–99)
Potassium: 3.8 mmol/L (ref 3.5–5.1)
Sodium: 140 mmol/L (ref 135–145)

## 2021-06-26 ENCOUNTER — Ambulatory Visit: Admit: 2021-06-26 | Payer: BC Managed Care – PPO | Admitting: Otolaryngology

## 2021-06-26 SURGERY — SINUS SURGERY, ENDOSCOPIC
Anesthesia: General | Laterality: Right

## 2021-06-30 NOTE — Anesthesia Preprocedure Evaluation (Addendum)
Anesthesia Evaluation  ?Patient identified by MRN, date of birth, ID band ?Patient awake ? ? ? ?Reviewed: ?Allergy & Precautions, NPO status , Patient's Chart, lab work & pertinent test results ? ?Airway ?Mallampati: I ? ?TM Distance: >3 FB ?Neck ROM: Full ? ? ? Dental ?no notable dental hx. ?(+) Teeth Intact, Dental Advisory Given ?  ?Pulmonary ?former smoker,  ?  ?Pulmonary exam normal ?breath sounds clear to auscultation ? ? ? ? ? ? Cardiovascular ?hypertension, Pt. on medications ?Normal cardiovascular exam ?Rhythm:Regular Rate:Normal ? ? ?  ?Neuro/Psych ?PSYCHIATRIC DISORDERS Anxiety negative neurological ROS ?   ? GI/Hepatic ?Neg liver ROS, GERD  Medicated and Controlled,  ?Endo/Other  ?Hypothyroidism  ? Renal/GU ?negative Renal ROS  ?negative genitourinary ?  ?Musculoskeletal ? ?(+) Fibromyalgia -, narcotic dependentTramadol BID   ? Abdominal ?  ?Peds ? Hematology ?negative hematology ROS ?(+)   ?Anesthesia Other Findings ? ? Reproductive/Obstetrics ?negative OB ROS ? ?  ? ? ? ? ? ? ? ? ? ? ? ? ? ?  ?  ? ? ? ? ? ? ? ?Anesthesia Physical ?Anesthesia Plan ? ?ASA: 2 ? ?Anesthesia Plan: General  ? ?Post-op Pain Management: Tylenol PO (pre-op)*  ? ?Induction: Intravenous ? ?PONV Risk Score and Plan: 4 or greater and Ondansetron, Dexamethasone, Midazolam and Treatment may vary due to age or medical condition ? ?Airway Management Planned: Oral ETT ? ?Additional Equipment: None ? ?Intra-op Plan:  ? ?Post-operative Plan: Extubation in OR ? ?Informed Consent: I have reviewed the patients History and Physical, chart, labs and discussed the procedure including the risks, benefits and alternatives for the proposed anesthesia with the patient or authorized representative who has indicated his/her understanding and acceptance.  ? ? ? ?Dental advisory given ? ?Plan Discussed with: CRNA ? ?Anesthesia Plan Comments:   ? ? ? ? ? ?Anesthesia Quick Evaluation ? ?

## 2021-06-30 NOTE — H&P (Signed)
HPI:  ? ?Caitlin Tucker is a 61 y.o. female who presents as a new patient. ? ?Caitlin Tucker is seen today at the request of her periodontist Caitlin Tucker. She is having right-sided maxillary tooth problems and dental CT scan showed an opacified right maxillary sinus. Caitlin Tucker states that some of the teeth that were worked on had roots that extended into the maxillary sinus and there was a defect which was closed with another procedure. After great effort we were able to visualize the films that were done at Caitlin Tucker office. This certainly confirmed right maxillary sinus opacification. She has had several courses of antibiotics the most recent being a Z-Pak last week. She states she is still producing copious amounts of malodorous green mucus. ?PMH/Meds/All/SocHx/FamHx/ROS:  ? ?Past Medical History:  ?Diagnosis Date  ? Hypertension  ? Thyroid disease  ? ?No past surgical history on file. ? ?No family history of bleeding disorders, wound healing problems or difficulty with anesthesia.  ? ?Social History  ? ?Socioeconomic History  ? Marital status: Married  ?Spouse name: Not on file  ? Number of children: Not on file  ? Years of education: Not on file  ? Highest education level: Not on file  ?Occupational History  ? Not on file  ?Tobacco Use  ? Smoking status: Former  ?Types: Cigarettes  ? Smokeless tobacco: Never  ?Vaping Use  ? Vaping Use: Never used  ?Substance and Sexual Activity  ? Alcohol use: Not Currently  ? Drug use: Never  ? Sexual activity: Not on file  ?Other Topics Concern  ? Not on file  ?Social History Narrative  ? Not on file  ? ?Social Determinants of Health  ? ?Financial Resource Strain: Not on file  ?Food Insecurity: Not on file  ?Transportation Needs: Not on file  ?Physical Activity: Not on file  ?Stress: Not on file  ?Social Connections: Not on file  ?Housing Stability: Not on file  ? ?Current Outpatient Medications:  ? chlorhexidine (PERIDEX) 0.12 % solution, , Disp: , Rfl:  ? levothyroxine  (SYNTHROID) 100 MCG tablet, Take 1 tablet (100 mcg total) by mouth daily., Disp: , Rfl:  ? acetaminophen (TYLENOL DISSOLVE PACKS) 500 mg powder, Take by mouth., Disp: , Rfl:  ? calcium-vitamin D 500 mg(1,250mg ) -200 unit per tablet, Take by mouth., Disp: , Rfl:  ? DULoxetine (CYMBALTA) 30 MG DR capsule, , Disp: , Rfl:  ? famotidine (PEPCID) 20 MG tablet, Take 1 tablet (20 mg total) by mouth., Disp: , Rfl:  ? hydroCHLOROthiazide (HYDRODIURIL) 25 MG tablet, , Disp: , Rfl:  ? meloxicam (MOBIC) 15 MG tablet, , Disp: , Rfl:  ? traMADoL (ULTRAM) 50 mg tablet, , Disp: , Rfl:  ? ?A complete ROS was performed with pertinent positives/negatives noted in the HPI. The remainder of the ROS are negative. ? ? ?Physical Exam:  ? ?BP 132/80  Pulse 78  Temp 97.2 ?F (36.2 ?C)  Resp 18  Wt 65.4 kg (144 lb 3.2 oz)  SpO2 98%  ? ?Constitutional:  ?Patient appears well-nourished and well-developed. No acute distress. ? ?Head/Face: ?Facial features are symmetric. Skull is normocephalic. Hair and scalp are normal. Normal temporal artery pulses. TMJ shows no joint deformity swelling or erythema. ? ?Eyes: ?Pupils are equal, round and reactive to light. Conjunctiva and lids are normal. Normal extraocular mobility. Normal vision by patient report. ? ?Ears:  ?Right: Pinna and external meatus normal, normal ear canal skin and caliber without excessive cerumen or drainage. Tympanic membranes intact without  effusion or infection. Hearing normal. ?Left: Pinna and external meatus normal, normal ear canal skin and caliber without excessive cerumen or drainage. Tympanic membranes intact without effusion or infection. Hearing normal. ? ?Nose/Sinus/Nasopharynx: ?Septum is normal. ?Normal nasal mucosa. ?Normal inferior turbinates. ?Normal middle and superior turbinates, sinus ostia patent without obstruction, mass or discharge. ?Nasopharynx patent. ? ?Oral cavity/Oropharynx: ?Lips normal, teeth and gums normal with good dentition, normal oral  vestibule. ?Normal floor of mouth, tongue and oral mucosa, no mucosal lesions, ulcer or mass, normal tongue mobility.  ?Hard and soft palate normal with normal mobility. ?One plus tonsils, no erythema or exudate. ?Base of tongue, retromolar trigone and oral pharynx normal. ?Normal sensation, mobility and gag. ? ?Neck: ?No cervical lymphadenopathy, mass or swelling. ?Salivary glands normal to palpation without swelling, erythema or mass. Normal facial nerve function. ?Normal thyroid gland palpation. ? ?Neurological: ?Alert and oriented to self, place and time. Normal reflexes and motor skills, balance and coordination. ? ?Psychiatric: ?No unusual anxiety or evidence of depression. Appropriate affect. ? ?Independent Review of Additional Tests or Records:  ?None. ? ?Procedures:  ?Procedure Note - Rigid Nasal Endoscopy ? ?Risks/benefits and possible complications of this procedure were discussed in detail and the patient understood and agreed to proceed. ? ?The nose was sprayed with oxymetazoline and 4% lidocaine. With the patient in the upright position, the rigid endscope was inserted into the nasal passage bilaterlly. Where visible, nasal secretions and mucosal crusting were removed with suction. The overall appearance of the nasal cavity and paranasal sinuses were noted and the findings are described below. ? ?Findings: Stream of cloudy mucus emanating from right middle meatal region ? ?The patient tolerated the procedure without difficulty and was discharged in stable condition. ? ?Kermit Balo. Spainhour, PA-C ?[facility] ? ?Impression & Plans:  ? ?1) right maxillary sinusitis ? ?Clindamycin 300 mg 1 p.o. 3 times daily ?May use saline spray as needed ?We will have her follow-up with one of our surgeons next week  ?

## 2021-07-01 ENCOUNTER — Ambulatory Visit (HOSPITAL_BASED_OUTPATIENT_CLINIC_OR_DEPARTMENT_OTHER): Payer: BC Managed Care – PPO | Admitting: Anesthesiology

## 2021-07-01 ENCOUNTER — Ambulatory Visit (HOSPITAL_BASED_OUTPATIENT_CLINIC_OR_DEPARTMENT_OTHER)
Admission: RE | Admit: 2021-07-01 | Discharge: 2021-07-01 | Disposition: A | Discharge status: Home / Self Care | Payer: BC Managed Care – PPO | Attending: Otolaryngology | Admitting: Otolaryngology

## 2021-07-01 ENCOUNTER — Encounter (HOSPITAL_BASED_OUTPATIENT_CLINIC_OR_DEPARTMENT_OTHER)
Admission: RE | Disposition: A | Discharge status: Home / Self Care | Payer: Self-pay | Source: Home / Self Care | Attending: Otolaryngology

## 2021-07-01 ENCOUNTER — Encounter (HOSPITAL_BASED_OUTPATIENT_CLINIC_OR_DEPARTMENT_OTHER): Payer: Self-pay | Admitting: Otolaryngology

## 2021-07-01 ENCOUNTER — Other Ambulatory Visit: Payer: Self-pay

## 2021-07-01 DIAGNOSIS — K219 Gastro-esophageal reflux disease without esophagitis: Secondary | ICD-10-CM | POA: Insufficient documentation

## 2021-07-01 DIAGNOSIS — J320 Chronic maxillary sinusitis: Secondary | ICD-10-CM | POA: Insufficient documentation

## 2021-07-01 DIAGNOSIS — E039 Hypothyroidism, unspecified: Secondary | ICD-10-CM | POA: Diagnosis not present

## 2021-07-01 DIAGNOSIS — I10 Essential (primary) hypertension: Secondary | ICD-10-CM | POA: Insufficient documentation

## 2021-07-01 DIAGNOSIS — Z87891 Personal history of nicotine dependence: Secondary | ICD-10-CM | POA: Diagnosis not present

## 2021-07-01 DIAGNOSIS — F419 Anxiety disorder, unspecified: Secondary | ICD-10-CM | POA: Insufficient documentation

## 2021-07-01 HISTORY — DX: Gastro-esophageal reflux disease without esophagitis: K21.9

## 2021-07-01 HISTORY — PX: MAXILLARY ANTROSTOMY: SHX2003

## 2021-07-01 HISTORY — DX: Anxiety disorder, unspecified: F41.9

## 2021-07-01 HISTORY — DX: Fibromyalgia: M79.7

## 2021-07-01 HISTORY — DX: Essential (primary) hypertension: I10

## 2021-07-01 HISTORY — DX: Hypothyroidism, unspecified: E03.9

## 2021-07-01 HISTORY — PX: NASAL SINUS SURGERY: SHX719

## 2021-07-01 SURGERY — SINUS SURGERY, ENDOSCOPIC
Anesthesia: General | Site: Nose | Laterality: Right

## 2021-07-01 MED ORDER — OXYMETAZOLINE HCL 0.05 % NA SOLN
NASAL | Status: AC
  Filled 2021-07-01: qty 30

## 2021-07-01 MED ORDER — LACTATED RINGERS IV SOLN: INTRAVENOUS | Status: DC

## 2021-07-01 MED ORDER — ROCURONIUM BROMIDE 100 MG/10ML IV SOLN
INTRAVENOUS | Status: DC | PRN
  Administered 2021-07-01: 70 mg via INTRAVENOUS

## 2021-07-01 MED ORDER — PROPOFOL 10 MG/ML IV BOLUS
INTRAVENOUS | Status: DC | PRN
Start: 2021-07-01 — End: 2021-07-01
  Administered 2021-07-01: 130 mg via INTRAVENOUS

## 2021-07-01 MED ORDER — OXYCODONE HCL 5 MG PO TABS
ORAL_TABLET | ORAL | Status: AC
  Filled 2021-07-01: qty 1

## 2021-07-01 MED ORDER — ONDANSETRON HCL 4 MG/2ML IJ SOLN
INTRAMUSCULAR | Status: DC | PRN
  Administered 2021-07-01: 4 mg via INTRAVENOUS

## 2021-07-01 MED ORDER — HYDROCODONE-ACETAMINOPHEN 7.5-325 MG PO TABS
1.0000 | ORAL_TABLET | Freq: Four times a day (QID) | ORAL | 0 refills | Status: AC | PRN
Start: 2021-07-01 — End: ?

## 2021-07-01 MED ORDER — MIDAZOLAM HCL 2 MG/2ML IJ SOLN
INTRAMUSCULAR | Status: AC
  Filled 2021-07-01: qty 2

## 2021-07-01 MED ORDER — LIDOCAINE-EPINEPHRINE 1 %-1:100000 IJ SOLN
INTRAMUSCULAR | Status: DC | PRN
  Administered 2021-07-01: 4 mL

## 2021-07-01 MED ORDER — HYDROMORPHONE HCL 1 MG/ML IJ SOLN
INTRAMUSCULAR | Status: AC
  Filled 2021-07-01: qty 0.5

## 2021-07-01 MED ORDER — HYDROMORPHONE HCL 1 MG/ML IJ SOLN
0.2500 mg | INTRAMUSCULAR | Status: DC | PRN
  Administered 2021-07-01 (×3): 0.5 mg via INTRAVENOUS

## 2021-07-01 MED ORDER — ONDANSETRON 4 MG PO TBDP: 4.0000 mg | ORAL_TABLET | Freq: Three times a day (TID) | ORAL | 0 refills | Status: AC | PRN

## 2021-07-01 MED ORDER — ACETAMINOPHEN 500 MG PO TABS
1000.0000 mg | ORAL_TABLET | Freq: Once | ORAL | Status: AC
  Administered 2021-07-01: 1000 mg via ORAL

## 2021-07-01 MED ORDER — DEXAMETHASONE SODIUM PHOSPHATE 4 MG/ML IJ SOLN
INTRAMUSCULAR | Status: DC | PRN
Start: 2021-07-01 — End: 2021-07-01
  Administered 2021-07-01: 10 mg via INTRAVENOUS

## 2021-07-01 MED ORDER — OXYCODONE HCL 5 MG PO TABS
5.0000 mg | ORAL_TABLET | Freq: Once | ORAL | Status: AC | PRN
  Administered 2021-07-01: 5 mg via ORAL

## 2021-07-01 MED ORDER — ONDANSETRON HCL 4 MG/2ML IJ SOLN: 4.0000 mg | Freq: Once | INTRAMUSCULAR | Status: DC | PRN

## 2021-07-01 MED ORDER — DEXAMETHASONE SODIUM PHOSPHATE 10 MG/ML IJ SOLN
INTRAMUSCULAR | Status: AC
  Filled 2021-07-01: qty 1

## 2021-07-01 MED ORDER — LIDOCAINE HCL (CARDIAC) PF 100 MG/5ML IV SOSY
PREFILLED_SYRINGE | INTRAVENOUS | Status: DC | PRN
  Administered 2021-07-01: 40 mg via INTRAVENOUS

## 2021-07-01 MED ORDER — ONDANSETRON HCL 4 MG/2ML IJ SOLN
INTRAMUSCULAR | Status: AC
  Filled 2021-07-01: qty 2

## 2021-07-01 MED ORDER — FENTANYL CITRATE (PF) 100 MCG/2ML IJ SOLN
INTRAMUSCULAR | Status: DC | PRN
  Administered 2021-07-01 (×2): 50 ug via INTRAVENOUS

## 2021-07-01 MED ORDER — ACETAMINOPHEN 500 MG PO TABS
ORAL_TABLET | ORAL | Status: AC
  Filled 2021-07-01: qty 2

## 2021-07-01 MED ORDER — OXYMETAZOLINE HCL 0.05 % NA SOLN
2.0000 | NASAL | Status: DC
  Administered 2021-07-01: 2 via NASAL

## 2021-07-01 MED ORDER — 0.9 % SODIUM CHLORIDE (POUR BTL) OPTIME
TOPICAL | Status: DC | PRN
  Administered 2021-07-01: 40 mL

## 2021-07-01 MED ORDER — PROPOFOL 10 MG/ML IV BOLUS
INTRAVENOUS | Status: AC
  Filled 2021-07-01: qty 20

## 2021-07-01 MED ORDER — OXYMETAZOLINE HCL 0.05 % NA SOLN
NASAL | Status: DC | PRN
  Administered 2021-07-01: 1 via TOPICAL

## 2021-07-01 MED ORDER — SUGAMMADEX SODIUM 200 MG/2ML IV SOLN
INTRAVENOUS | Status: DC | PRN
  Administered 2021-07-01: 275 mg via INTRAVENOUS

## 2021-07-01 MED ORDER — AMISULPRIDE (ANTIEMETIC) 5 MG/2ML IV SOLN: 10.0000 mg | Freq: Once | INTRAVENOUS | Status: DC | PRN

## 2021-07-01 MED ORDER — OXYCODONE HCL 5 MG/5ML PO SOLN: 5.0000 mg | Freq: Once | ORAL | Status: AC | PRN

## 2021-07-01 MED ORDER — ROCURONIUM BROMIDE 10 MG/ML (PF) SYRINGE
PREFILLED_SYRINGE | INTRAVENOUS | Status: AC
  Filled 2021-07-01: qty 10

## 2021-07-01 MED ORDER — FENTANYL CITRATE (PF) 100 MCG/2ML IJ SOLN
INTRAMUSCULAR | Status: AC
  Filled 2021-07-01: qty 2

## 2021-07-01 SURGICAL SUPPLY — 46 items
ATTRACTOMAT 16X20 MAGNETIC DRP (DRAPES) ×1 IMPLANT
BLADE RAD40 ROTATE 4M 4 5PK (BLADE) IMPLANT
BLADE RAD60 ROTATE M4 4 5PK (BLADE) IMPLANT
BLADE TRICUT ROTATE M4 4 5PK (BLADE) IMPLANT
BUR HS RAD FRONTAL 3 (BURR) IMPLANT
CANISTER SUC SOCK COL 7IN (MISCELLANEOUS) ×2 IMPLANT
CANISTER SUCT 1200ML W/VALVE (MISCELLANEOUS) ×3 IMPLANT
CORD BIPOLAR FORCEPS 12FT (ELECTRODE) IMPLANT
DEFOGGER MIRROR 1QT (MISCELLANEOUS) ×2 IMPLANT
DRESSING NASAL KENNEDY 3.5X.9 (MISCELLANEOUS) IMPLANT
DRSG CURAD 3X16 NADH (PACKING) IMPLANT
DRSG NASAL KENNEDY 3.5X.9 (MISCELLANEOUS)
DRSG NASAL KENNEDY LMNT 8CM (GAUZE/BANDAGES/DRESSINGS) IMPLANT
DRSG NASOPORE 8CM (GAUZE/BANDAGES/DRESSINGS) ×1 IMPLANT
DRSG TELFA 3X8 NADH (GAUZE/BANDAGES/DRESSINGS) IMPLANT
FORCEPS BIPOLAR SPETZLER 8 1.0 (NEUROSURGERY SUPPLIES) IMPLANT
GAUZE 4X4 16PLY ~~LOC~~+RFID DBL (SPONGE) IMPLANT
GAUZE VASELINE FOILPK 1/2 X 72 (GAUZE/BANDAGES/DRESSINGS) IMPLANT
GLOVE BIO SURGEON STRL SZ7 (GLOVE) ×1 IMPLANT
GLOVE SURG SYN 7.5  E (GLOVE) ×1
GLOVE SURG SYN 7.5 E (GLOVE) ×1 IMPLANT
GLOVE SURG SYN 7.5 PF PI (GLOVE) ×1 IMPLANT
GOWN STRL REUS W/ TWL LRG LVL3 (GOWN DISPOSABLE) ×2 IMPLANT
GOWN STRL REUS W/ TWL XL LVL3 (GOWN DISPOSABLE) ×1 IMPLANT
GOWN STRL REUS W/TWL LRG LVL3 (GOWN DISPOSABLE) ×2
GOWN STRL REUS W/TWL XL LVL3 (GOWN DISPOSABLE) ×2
HEMOSTAT SURGICEL .5X2 ABSORB (HEMOSTASIS) IMPLANT
IV NS 500ML (IV SOLUTION)
IV NS 500ML BAXH (IV SOLUTION) IMPLANT
NDL HYPO 27GX1-1/4 (NEEDLE) ×1 IMPLANT
NDL SPNL 25GX3.5 QUINCKE BL (NEEDLE) IMPLANT
NEEDLE HYPO 27GX1-1/4 (NEEDLE) ×2 IMPLANT
NEEDLE SPNL 25GX3.5 QUINCKE BL (NEEDLE) IMPLANT
NS IRRIG 1000ML POUR BTL (IV SOLUTION) ×2 IMPLANT
PACK BASIN DAY SURGERY FS (CUSTOM PROCEDURE TRAY) ×2 IMPLANT
PACK ENT DAY SURGERY (CUSTOM PROCEDURE TRAY) ×2 IMPLANT
PAD DRESSING TELFA 3X8 NADH (GAUZE/BANDAGES/DRESSINGS) IMPLANT
PATTIES SURGICAL .5 X3 (DISPOSABLE) ×2 IMPLANT
SLEEVE SCD COMPRESS KNEE MED (STOCKING) ×2 IMPLANT
SPIKE FLUID TRANSFER (MISCELLANEOUS) IMPLANT
SPONGE GAUZE 2X2 8PLY STRL LF (GAUZE/BANDAGES/DRESSINGS) ×2 IMPLANT
SPONGE SURGIFOAM ABS GEL 12-7 (HEMOSTASIS) IMPLANT
TOWEL GREEN STERILE FF (TOWEL DISPOSABLE) ×3 IMPLANT
TUBE CONNECTING 20X1/4 (TUBING) ×1 IMPLANT
TUBE SALEM SUMP 16 FR W/ARV (TUBING) ×1 IMPLANT
YANKAUER SUCT BULB TIP NO VENT (SUCTIONS) ×2 IMPLANT

## 2021-07-01 NOTE — Anesthesia Procedure Notes (Signed)
Procedure Name: Intubation ?Date/Time: 07/01/2021 9:29 AM ?Performed by: Maryella Shivers, CRNA ?Pre-anesthesia Checklist: Patient identified, Emergency Drugs available, Suction available and Patient being monitored ?Patient Re-evaluated:Patient Re-evaluated prior to induction ?Oxygen Delivery Method: Circle system utilized ?Preoxygenation: Pre-oxygenation with 100% oxygen ?Induction Type: IV induction ?Ventilation: Mask ventilation without difficulty ?Laryngoscope Size: Mac and 3 ?Grade View: Grade I ?Tube type: Oral Dwyane Luo ?Tube size: 7.0 mm ?Number of attempts: 1 ?Airway Equipment and Method: Stylet and Oral airway ?Placement Confirmation: ETT inserted through vocal cords under direct vision, positive ETCO2 and breath sounds checked- equal and bilateral ?Secured at: 20 cm ?Tube secured with: Tape ?Dental Injury: Teeth and Oropharynx as per pre-operative assessment  ? ? ? ? ?

## 2021-07-01 NOTE — Discharge Instructions (Addendum)
Next dose of Tylenol due anytime after 1:45 if needed ? ? ?Use nasal saline spray on the right side every 30-60 minutes while awake. ? ? ?Post Anesthesia Home Care Instructions ? ?Activity: ?Get plenty of rest for the remainder of the day. A responsible individual must stay with you for 24 hours following the procedure.  ?For the next 24 hours, DO NOT: ?-Drive a car ?-Advertising copywriter ?-Drink alcoholic beverages ?-Take any medication unless instructed by your physician ?-Make any legal decisions or sign important papers. ? ?Meals: ?Start with liquid foods such as gelatin or soup. Progress to regular foods as tolerated. Avoid greasy, spicy, heavy foods. If nausea and/or vomiting occur, drink only clear liquids until the nausea and/or vomiting subsides. Call your physician if vomiting continues. ? ?Special Instructions/Symptoms: ?Your throat may feel dry or sore from the anesthesia or the breathing tube placed in your throat during surgery. If this causes discomfort, gargle with warm salt water. The discomfort should disappear within 24 hours. ? ?If you had a scopolamine patch placed behind your ear for the management of post- operative nausea and/or vomiting: ? ?1. The medication in the patch is effective for 72 hours, after which it should be removed.  Wrap patch in a tissue and discard in the trash. Wash hands thoroughly with soap and water. ?2. You may remove the patch earlier than 72 hours if you experience unpleasant side effects which may include dry mouth, dizziness or visual disturbances. ?3. Avoid touching the patch. Wash your hands with soap and water after contact with the patch. ?    ?

## 2021-07-01 NOTE — Interval H&P Note (Signed)
History and Physical Interval Note: ? ?07/01/2021 ?8:53 AM ? ?Caitlin Tucker  has presented today for surgery, with the diagnosis of Right maxillary sinusitis; Right maxillary sinus opacification.  The various methods of treatment have been discussed with the patient and family. After consideration of risks, benefits and other options for treatment, the patient has consented to  Procedure(s): ?ENDOSCOPIC SINUS SURGERY (Right) ?MAXILLARY ANTROSTOMY WITH TISSUE REMOVAL (Right) as a surgical intervention.  The patient's history has been reviewed, patient examined, no change in status, stable for surgery.  I have reviewed the patient's chart and labs.  Questions were answered to the patient's satisfaction.   ? ? ?Serena Colonel ? ? ?

## 2021-07-01 NOTE — Anesthesia Postprocedure Evaluation (Signed)
Anesthesia Post Note ? ?Patient: Caitlin Tucker ? ?Procedure(s) Performed: ENDOSCOPIC SINUS SURGERY (Right: Nose) ?MAXILLARY ANTROSTOMY WITH TISSUE REMOVAL (Right: Nose) ? ?  ? ?Patient location during evaluation: PACU ?Anesthesia Type: General ?Level of consciousness: awake and alert, oriented and patient cooperative ?Pain management: pain level controlled ?Vital Signs Assessment: post-procedure vital signs reviewed and stable ?Respiratory status: spontaneous breathing, nonlabored ventilation and respiratory function stable ?Cardiovascular status: blood pressure returned to baseline and stable ?Postop Assessment: no apparent nausea or vomiting ?Anesthetic complications: no ? ? ?No notable events documented. ? ?Last Vitals:  ?Vitals:  ? 07/01/21 1112 07/01/21 1115  ?BP:  (!) 157/90  ?Pulse: 75 84  ?Resp: 13 17  ?Temp:    ?SpO2: 95% 97%  ?  ?Last Pain:  ?Vitals:  ? 07/01/21 1100  ?TempSrc:   ?PainSc: 6   ? ? ?  ?  ?  ?  ?  ?  ? ?Caitlin Tucker ? ? ? ? ?

## 2021-07-01 NOTE — Op Note (Signed)
OPERATIVE REPORT ? ?DATE OF SURGERY: 07/01/2021 ? ?PATIENT:  Caitlin Tucker,  61 y.o. female ? ?PRE-OPERATIVE DIAGNOSIS:  Right maxillary sinusitis; Right maxillary sinus opacification ? ?POST-OPERATIVE DIAGNOSIS:  Right maxillary sinusitis; Right maxillary sinus opacification ? ?PROCEDURE:  Procedure(s): ?ENDOSCOPIC SINUS SURGERY ?MAXILLARY ANTROSTOMY WITH TISSUE REMOVAL ? ?SURGEON:  Beckie Salts, MD ? ?ASSISTANTS: None ? ?ANESTHESIA:   General  ? ?EBL: 50 ml ? ?DRAINS: None ? ?LOCAL MEDICATIONS USED: 1% Xylocaine with epinephrine ? ?SPECIMEN: Right nasal/sinus contents ? ?COUNTS:  Correct ? ?PROCEDURE DETAILS: ?The patient was taken to the operating room and placed on the operating table in the supine position. Following induction of general endotracheal anesthesia, the face was prepped and draped in standard fashion.  Oxymetazoline spray was used preoperatively in the nasal cavities.  The 0 degree endoscope was used initially in the right nasal cavity to inspect the infundibular region.  Local anesthetic was infiltrated into the superior and posterior attachments of the right middle turbinate and the lateral nasal wall.  The base of the uncinate was taken down with a sickle knife and then removed.  The natural ostium was identified and entered with curved suction.  Backbiting forceps was used to enlarge the ostium anteriorly and straight and angled through cut forceps were used to enlarge the ostium posteriorly.  There were purulent secretions filling the right maxillary antrum.  There is a large polypoid mass as well.  This was all cleared out using combination of suction and angled forceps.  The ethmoid bulla was opened and cleaned of polypoid tissue to facilitate clearing the infundibular obstruction.  The maxillary antrum was irrigated with a curved suction attached to a syringe with saline twice.  All secretions were then suctioned including the pharynx.  Afrin pledgets were placed in the right infundibular  region which were then removed upon extubation.  Patient was then awakened extubated and transferred to recovery in stable condition ? ? ?PATIENT DISPOSITION:  To PACU, stable ? ? ? ?

## 2021-07-01 NOTE — Transfer of Care (Signed)
Immediate Anesthesia Transfer of Care Note ? ?Patient: Caitlin Tucker ? ?Procedure(s) Performed: ENDOSCOPIC SINUS SURGERY (Right: Nose) ?MAXILLARY ANTROSTOMY WITH TISSUE REMOVAL (Right: Nose) ? ?Patient Location: PACU ? ?Anesthesia Type:General ? ?Level of Consciousness: awake, alert  and oriented ? ?Airway & Oxygen Therapy: Patient Spontanous Breathing and Patient connected to face mask oxygen ? ?Post-op Assessment: Report given to RN and Post -op Vital signs reviewed and stable ? ?Post vital signs: Reviewed and stable ? ?Last Vitals:  ?Vitals Value Taken Time  ?BP 179/103 07/01/21 1015  ?Temp    ?Pulse 85 07/01/21 1016  ?Resp 11 07/01/21 1016  ?SpO2 99 % 07/01/21 1016  ?Vitals shown include unvalidated device data. ? ?Last Pain:  ?Vitals:  ? 07/01/21 0745  ?TempSrc: Oral  ?PainSc: 4   ?   ? ?Patients Stated Pain Goal: 6 (07/01/21 0745) ? ?Complications: No notable events documented. ?

## 2021-07-02 ENCOUNTER — Encounter (HOSPITAL_BASED_OUTPATIENT_CLINIC_OR_DEPARTMENT_OTHER): Payer: Self-pay | Admitting: Otolaryngology

## 2021-07-02 LAB — SURGICAL PATHOLOGY

## 2021-07-02 NOTE — Progress Notes (Signed)
Left message stating courtesy call and if any questions or concerns please call the doctors office.  

## 2021-07-05 ENCOUNTER — Emergency Department
Admission: EM | Admit: 2021-07-05 | Discharge: 2021-07-05 | Disposition: A | Discharge status: Home / Self Care | Payer: BC Managed Care – PPO | Attending: Emergency Medicine | Admitting: Emergency Medicine

## 2021-07-05 ENCOUNTER — Emergency Department: Payer: BC Managed Care – PPO

## 2021-07-05 ENCOUNTER — Encounter: Payer: Self-pay | Admitting: Emergency Medicine

## 2021-07-05 ENCOUNTER — Other Ambulatory Visit: Payer: Self-pay

## 2021-07-05 DIAGNOSIS — I10 Essential (primary) hypertension: Secondary | ICD-10-CM | POA: Diagnosis not present

## 2021-07-05 DIAGNOSIS — J329 Chronic sinusitis, unspecified: Secondary | ICD-10-CM | POA: Insufficient documentation

## 2021-07-05 DIAGNOSIS — G8918 Other acute postprocedural pain: Secondary | ICD-10-CM | POA: Diagnosis not present

## 2021-07-05 DIAGNOSIS — R519 Headache, unspecified: Secondary | ICD-10-CM | POA: Diagnosis present

## 2021-07-05 LAB — COMPREHENSIVE METABOLIC PANEL
ALT: 20 U/L (ref 0–44)
AST: 32 U/L (ref 15–41)
Albumin: 3.7 g/dL (ref 3.5–5.0)
Alkaline Phosphatase: 37 U/L — ABNORMAL LOW (ref 38–126)
Anion gap: 10 (ref 5–15)
BUN: 9 mg/dL (ref 6–20)
CO2: 26 mmol/L (ref 22–32)
Calcium: 9.2 mg/dL (ref 8.9–10.3)
Chloride: 101 mmol/L (ref 98–111)
Creatinine, Ser: 0.75 mg/dL (ref 0.44–1.00)
GFR, Estimated: >60 mL/min (ref >60)
Glucose, Bld: 115 mg/dL — ABNORMAL HIGH (ref 70–99)
Potassium: 4.2 mmol/L (ref 3.5–5.1)
Sodium: 137 mmol/L (ref 135–145)
Total Bilirubin: 1.3 mg/dL — ABNORMAL HIGH (ref 0.3–1.2)
Total Protein: 6.9 g/dL (ref 6.5–8.1)

## 2021-07-05 LAB — CBC
HCT: 40.4 % (ref 36.0–46.0)
Hemoglobin: 13.1 g/dL (ref 12.0–15.0)
MCH: 30.5 pg (ref 26.0–34.0)
MCHC: 32.4 g/dL (ref 30.0–36.0)
MCV: 94 fL (ref 80.0–100.0)
Platelets: 319 10*3/uL (ref 150–400)
RBC: 4.3 MIL/uL (ref 3.87–5.11)
RDW: 12.4 % (ref 11.5–15.5)
WBC: 8.8 10*3/uL (ref 4.0–10.5)
nRBC: 0 % (ref 0.0–0.2)

## 2021-07-05 IMAGING — CT CT HEAD W/O CM
4 series · 16 of 47 positions shown, 18 images · non-contrast
Comparison: None Available.

CLINICAL DATA: Headache, recent sinus surgery

EXAM:
CT HEAD WITHOUT CONTRAST
CT MAXILLOFACIAL WITHOUT CONTRAST
TECHNIQUE: Multidetector CT imaging of the head and maxillofacial structures
were performed using the standard protocol without intravenous
contrast. Multiplanar CT image reconstructions of the maxillofacial
structures were also generated.
RADIATION DOSE REDUCTION: This exam was performed according to the
departmental dose-optimization program which includes automated
exposure control, adjustment of the mA and/or kV according to
patient size and/or use of iterative reconstruction technique.

[Series 2: head wo · axial · 0.43mm/px · z∈[-137,-17]mm · 7 of 33 slices shown, 9 images]
[im 5/33  brain]
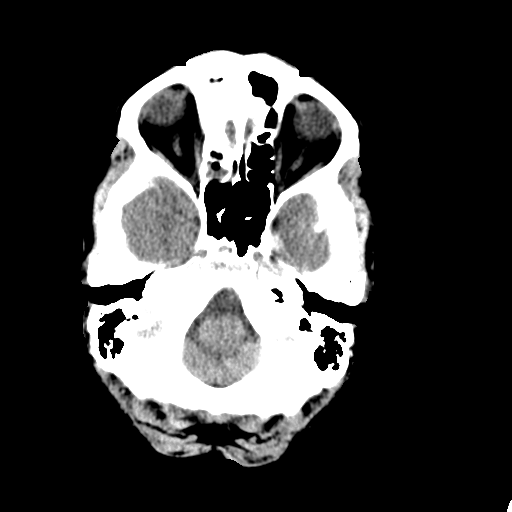
[im 5/33  bone]
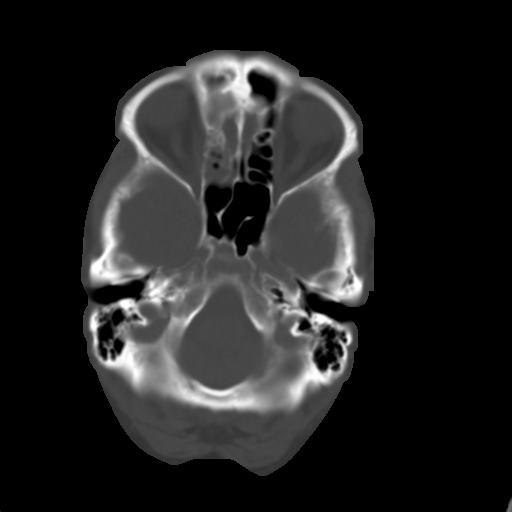
[im 9/33  brain]
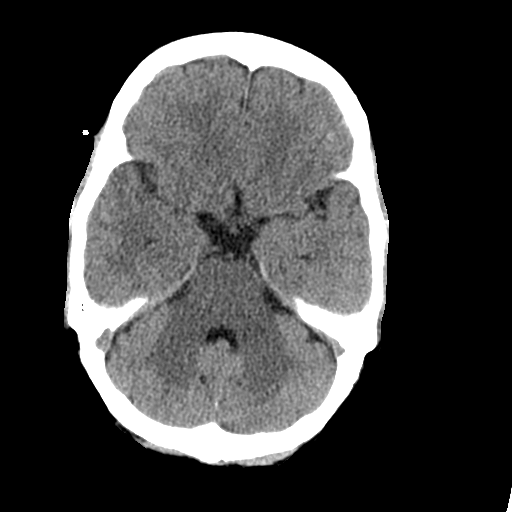
[im 13/33  brain]
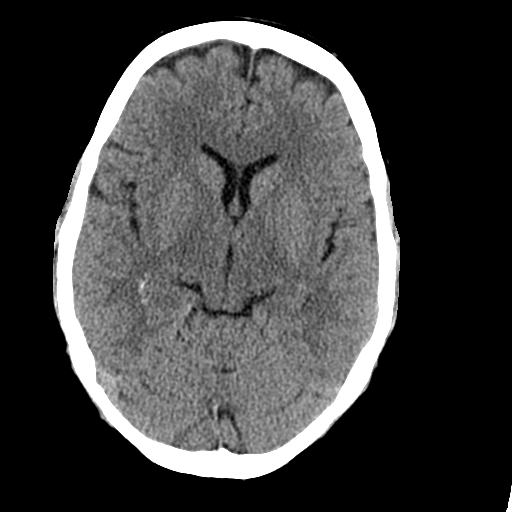
[im 17/33  brain]
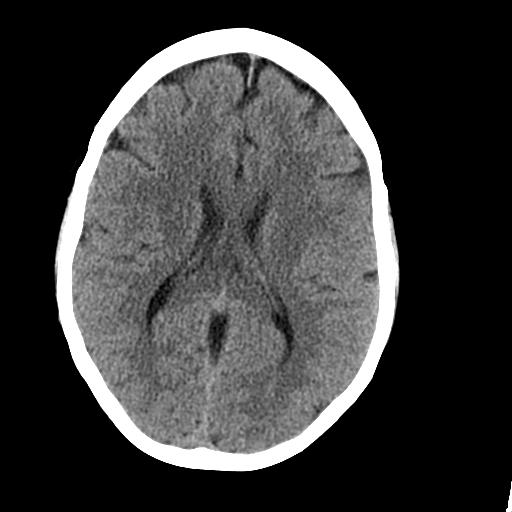
[im 21/33  brain]
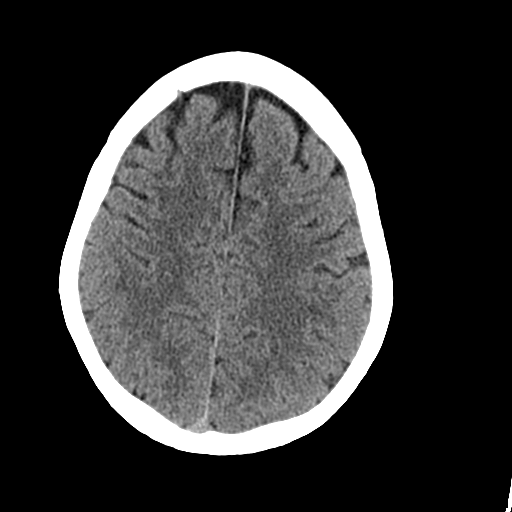
[im 21/33  bone]
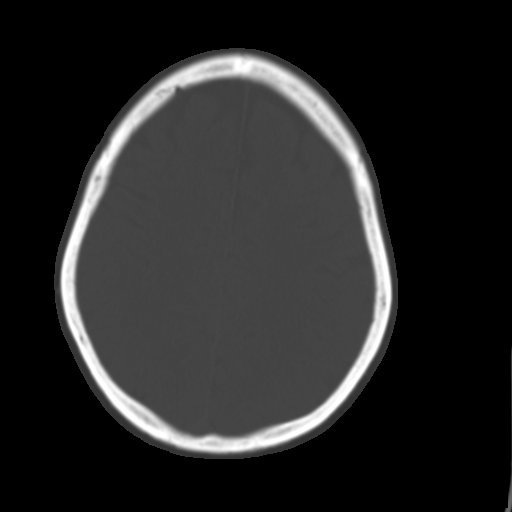
[im 25/33  brain]
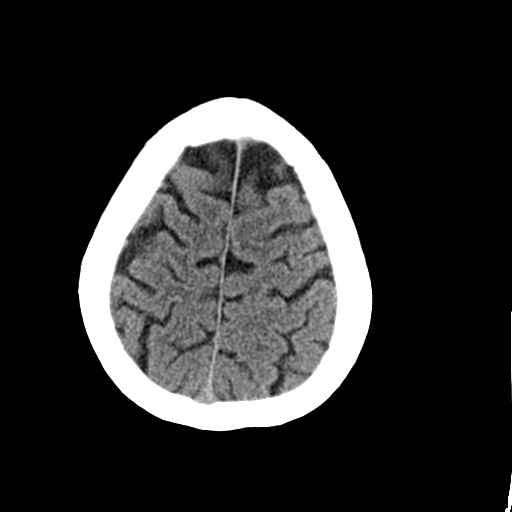
[im 29/33  brain]
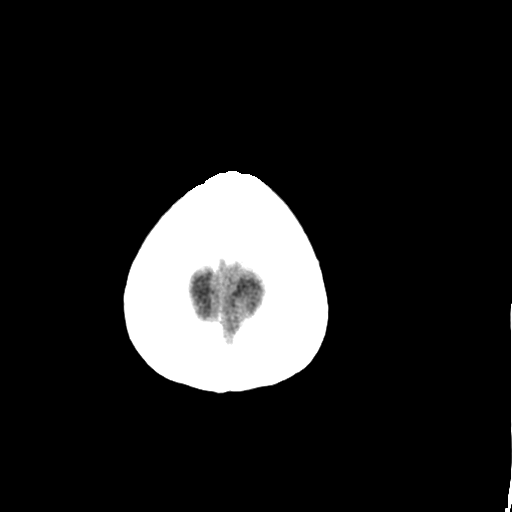

[Series 3: head bone · axial · 0.43mm/px · z∈[-141,-109]mm · 3 of 82 slices shown]
[im 9/82  bone]
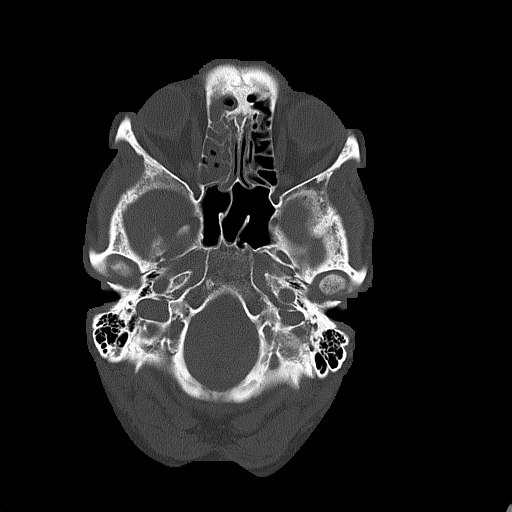
[im 17/82  bone]
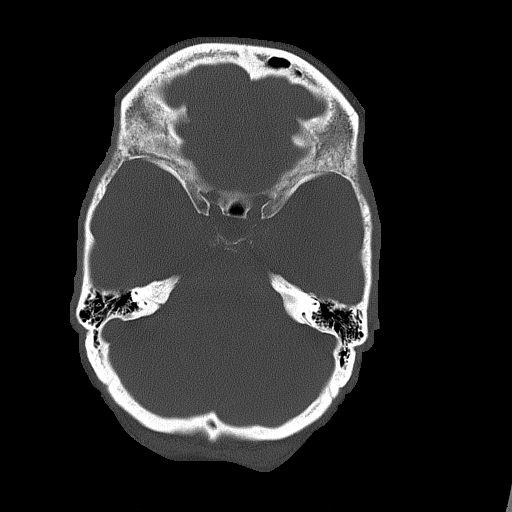
[im 25/82  bone]
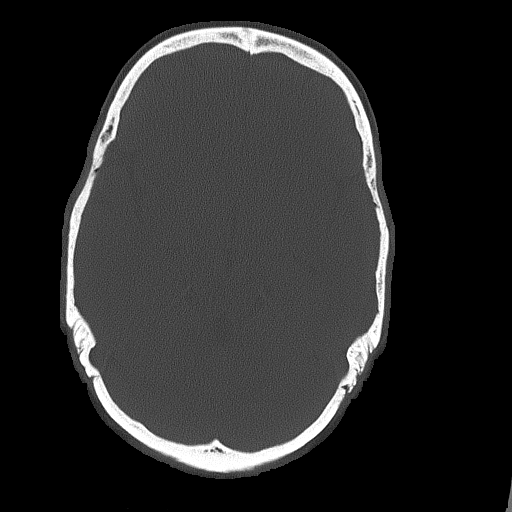

[Series 4: coronal soft tissue · coronal · 0.34mm/px · 3 of 73 slices shown]
[im 25/73  brain]
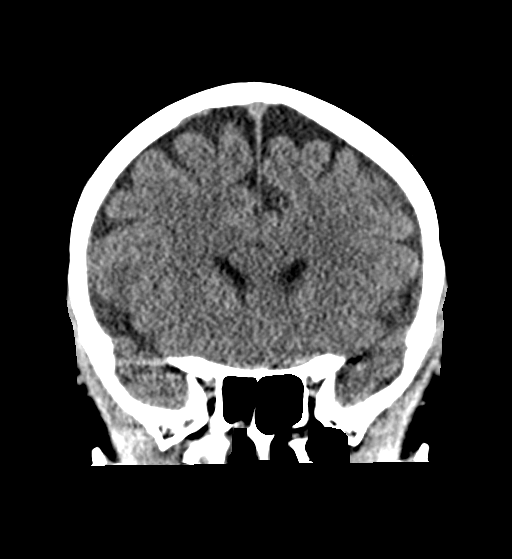
[im 33/73  brain]
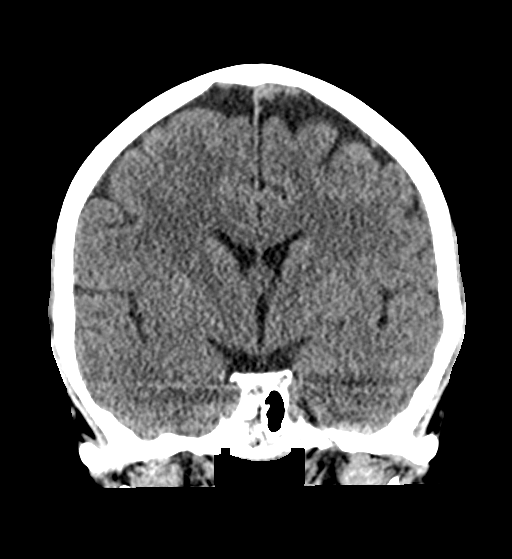
[im 41/73  brain]
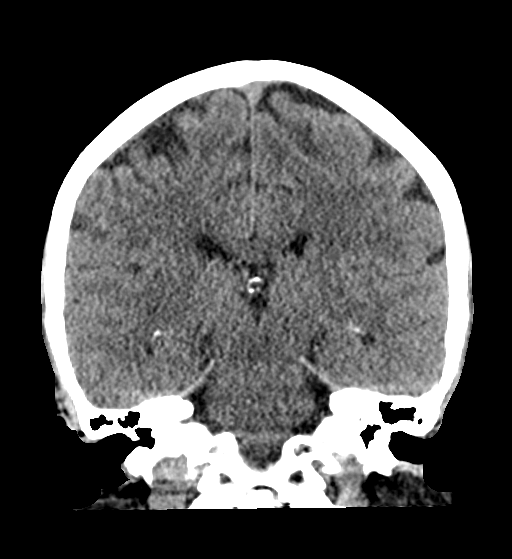

[Series 5: sagittal soft tissue · sagittal · 0.34mm/px · 3 of 56 slices shown]
[im 19/56  brain]
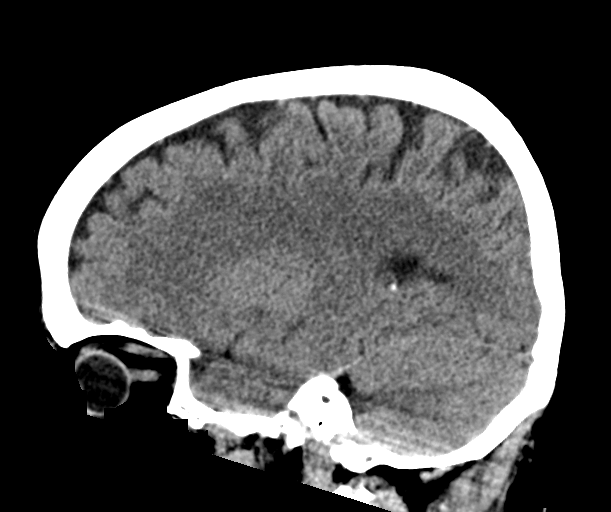
[im 28/56  brain]
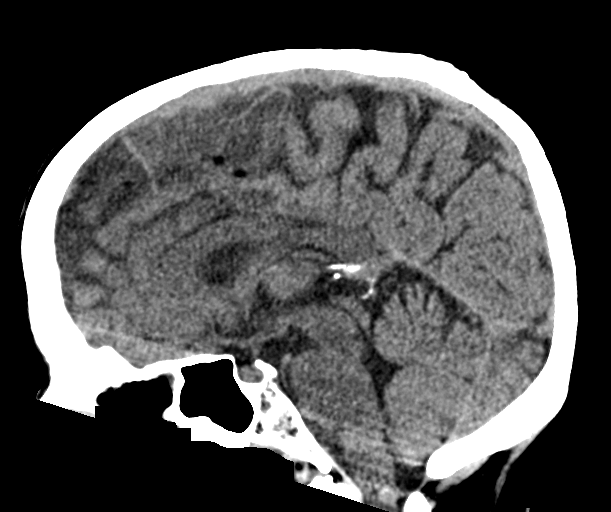
[im 37/56  brain]
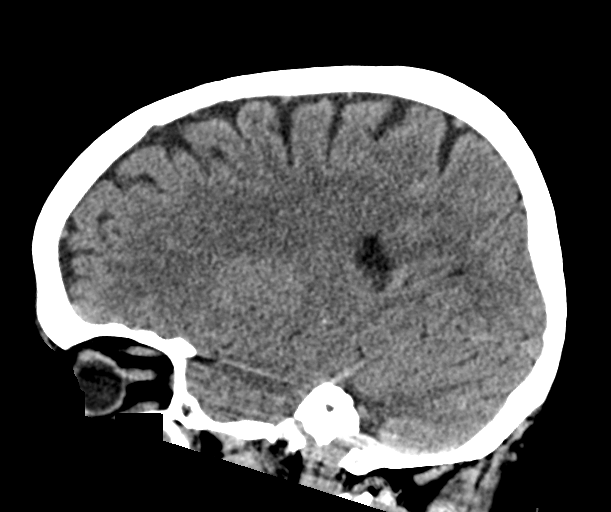

[16 of 47 positions shown; findings below may reference images not displayed]

FINDINGS: CT HEAD FINDINGS

Brain: No evidence of acute infarction, hemorrhage, hydrocephalus,
extra-axial collection or mass lesion/mass effect.

Vascular: No hyperdense vessel or unexpected calcification.

Skull: Normal. Negative for fracture or focal lesion.

Other: None.

CT MAXILLOFACIAL FINDINGS

Osseous: No evidence of maxillofacial fracture. Nasal bones are
intact. Mandible is intact. Bilateral mandibular condyles are
well-seated in the TMJs.

Orbits: Bilateral orbits, including the globes and retroconal soft
tissues, are within normal limits.

Sinuses: Near complete opacification the right ethmoid and maxillary
sinuses. Visualized paranasal sinuses and mastoid air cells are
otherwise clear.

Soft tissues: Negative.
IMPRESSION: Normal head CT.

Right paranasal sinus disease, as above. Otherwise negative
maxillofacial CT.

## 2021-07-05 IMAGING — CT CT MAXILLOFACIAL W/ CM
3 of 5 series · 15 of 47 positions shown, 18 images · non-contrast
Comparison: None Available.

CLINICAL DATA: Headache, recent sinus surgery

EXAM:
CT HEAD WITHOUT CONTRAST
CT MAXILLOFACIAL WITHOUT CONTRAST
TECHNIQUE: Multidetector CT imaging of the head and maxillofacial structures
were performed using the standard protocol without intravenous
contrast. Multiplanar CT image reconstructions of the maxillofacial
structures were also generated.
RADIATION DOSE REDUCTION: This exam was performed according to the
departmental dose-optimization program which includes automated
exposure control, adjustment of the mA and/or kV according to
patient size and/or use of iterative reconstruction technique.

[Series 2: max soft · axial · 0.33mm/px · z∈[-253,-113]mm · 10 of 82 slices shown, 13 images]
[im 6/82  brain]
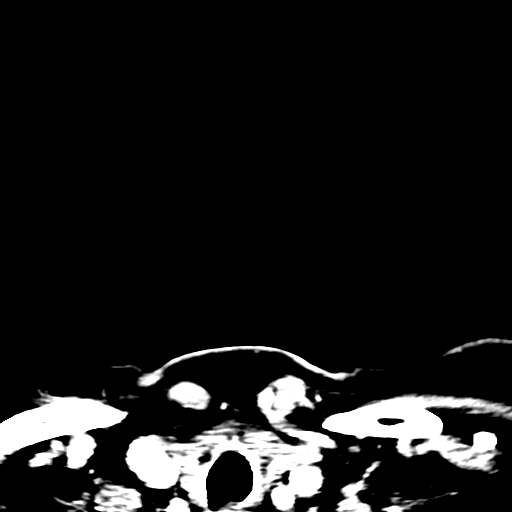
[im 6/82  bone]
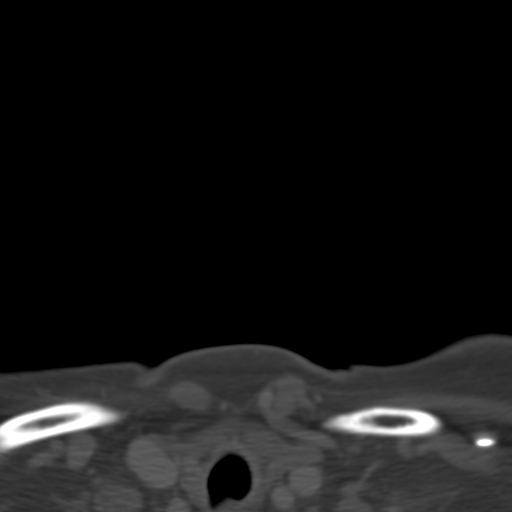
[im 14/82  bone]
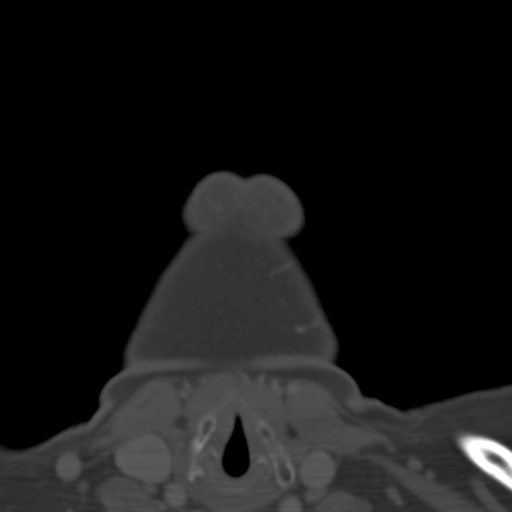
[im 23/82  bone]
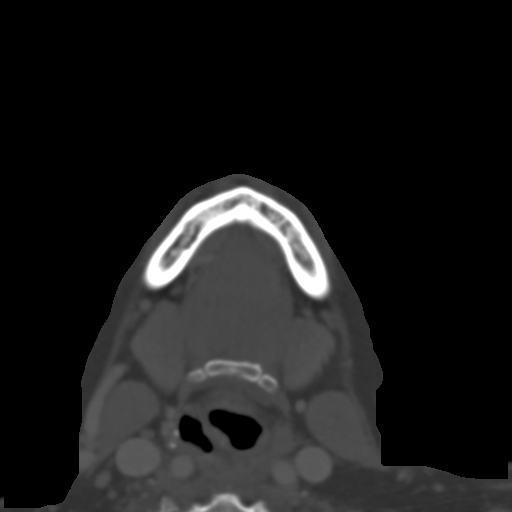
[im 28/82  bone]
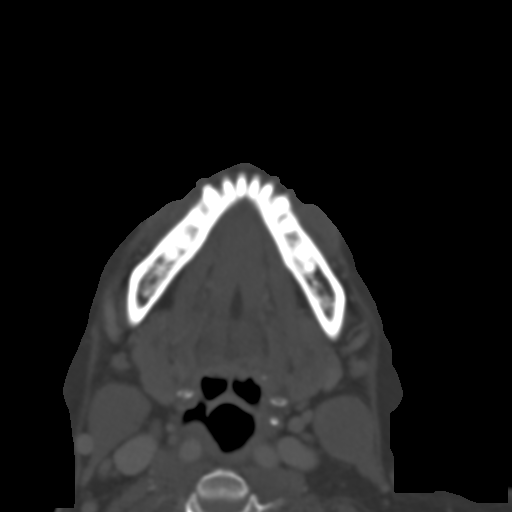
[im 37/82  brain]
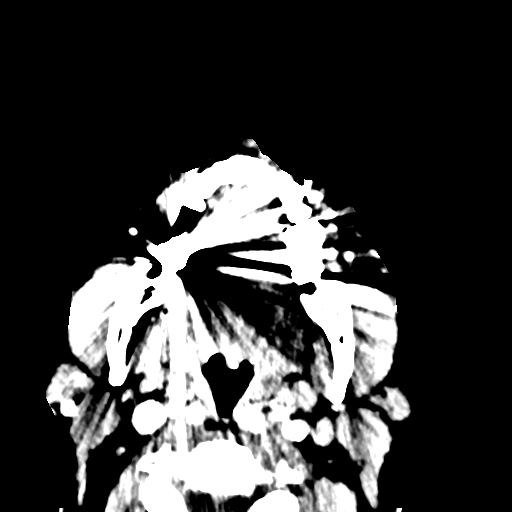
[im 37/82  bone]
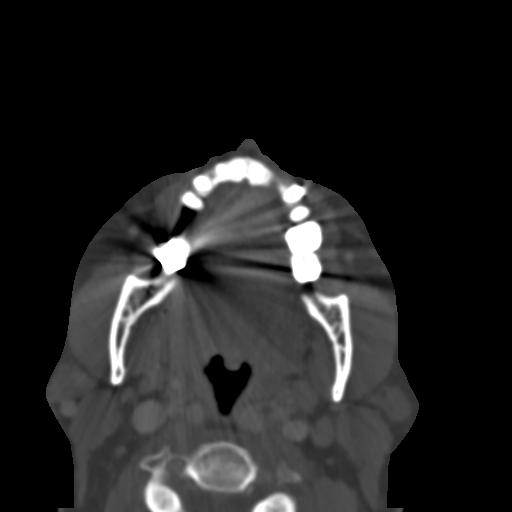
[im 45/82  bone]
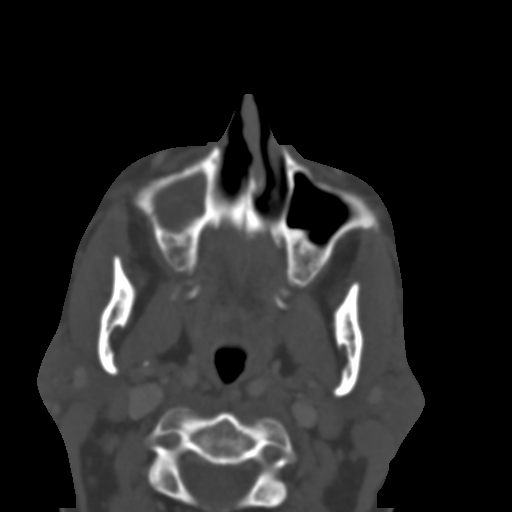
[im 54/82  bone]
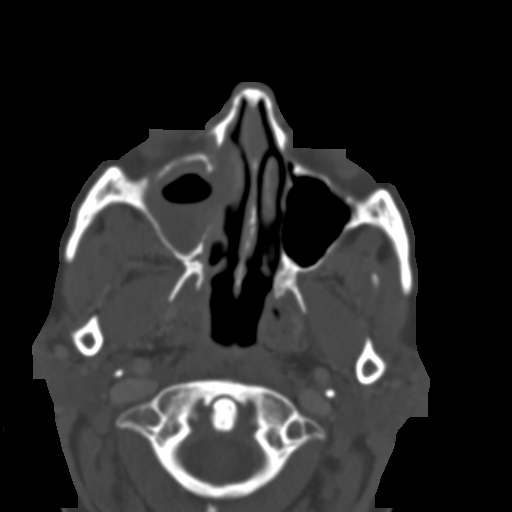
[im 62/82  bone]
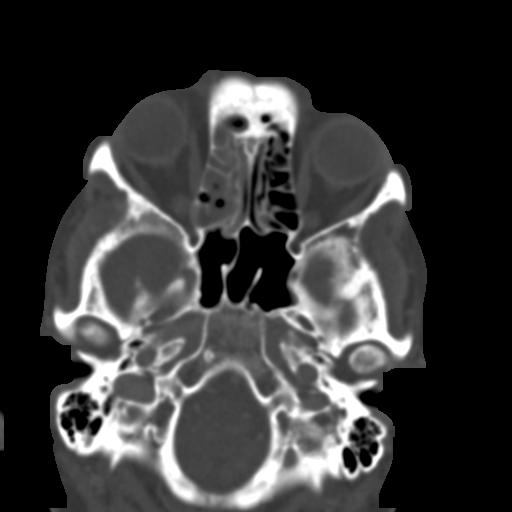
[im 68/82  brain]
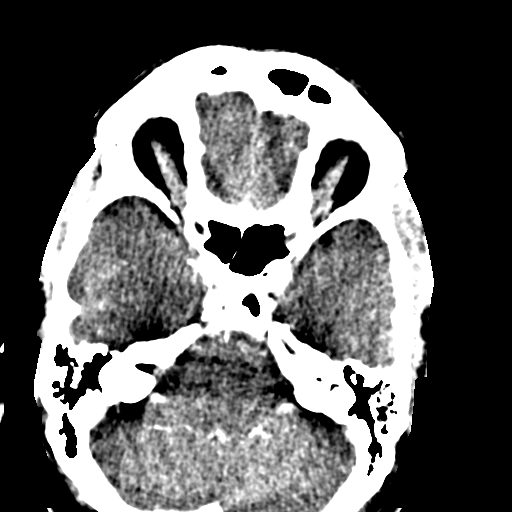
[im 68/82  bone]
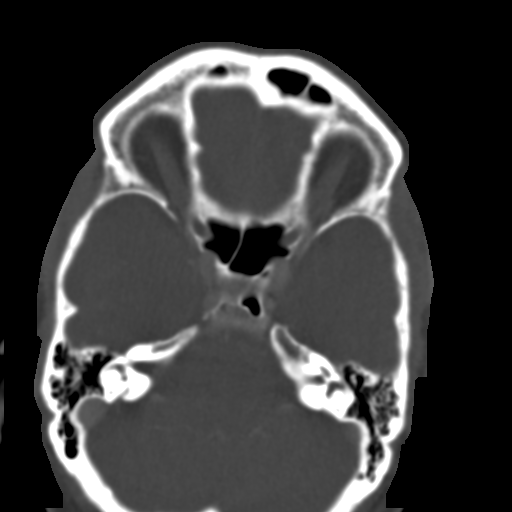
[im 76/82  bone]
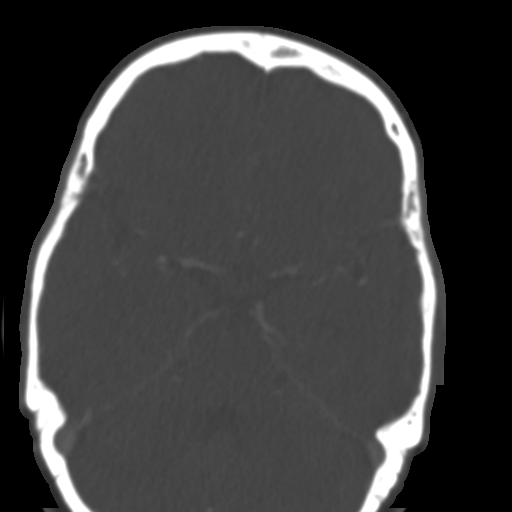

[Series 6: coronal bone · coronal · 0.30mm/px · 3 of 103 slices shown]
[im 26/103  bone]
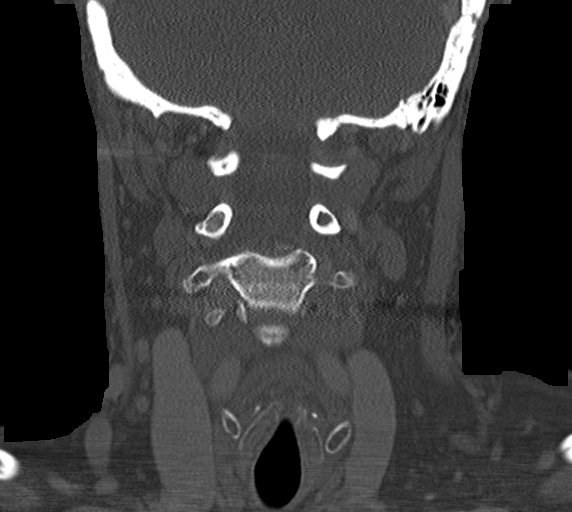
[im 52/103  bone]
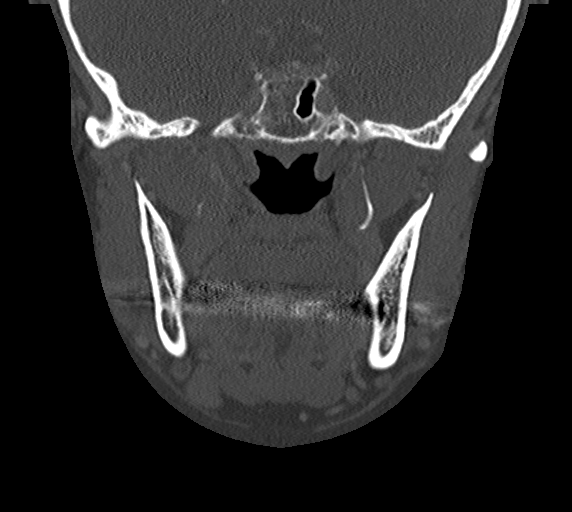
[im 77/103  bone]
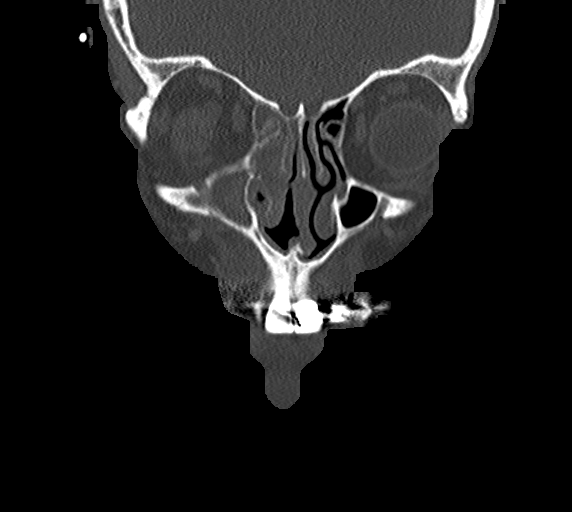

[Series 7: sagittal bone · sagittal · 0.33mm/px · 2 of 86 slices shown]
[im 29/86  bone]
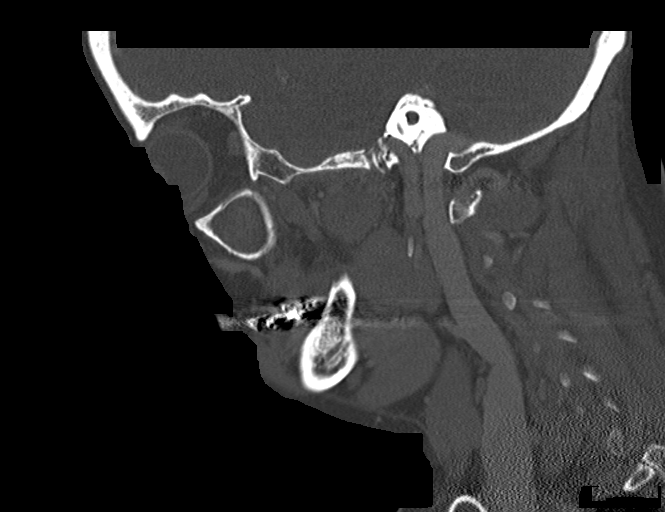
[im 57/86  bone]
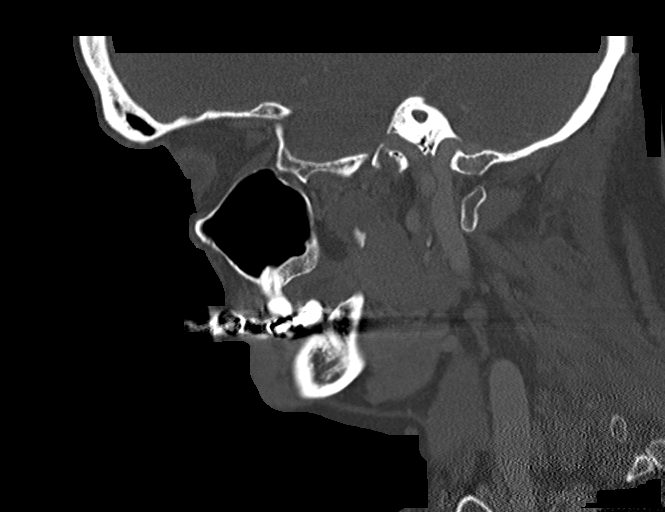

[15 of 47 positions shown; findings below may reference images not displayed]

FINDINGS: CT HEAD FINDINGS

Brain: No evidence of acute infarction, hemorrhage, hydrocephalus,
extra-axial collection or mass lesion/mass effect.

Vascular: No hyperdense vessel or unexpected calcification.

Skull: Normal. Negative for fracture or focal lesion.

Other: None.

CT MAXILLOFACIAL FINDINGS

Osseous: No evidence of maxillofacial fracture. Nasal bones are
intact. Mandible is intact. Bilateral mandibular condyles are
well-seated in the TMJs.

Orbits: Bilateral orbits, including the globes and retroconal soft
tissues, are within normal limits.

Sinuses: Near complete opacification the right ethmoid and maxillary
sinuses. Visualized paranasal sinuses and mastoid air cells are
otherwise clear.

Soft tissues: Negative.
IMPRESSION: Normal head CT.

Right paranasal sinus disease, as above. Otherwise negative
maxillofacial CT.

## 2021-07-05 MED ORDER — DIPHENHYDRAMINE HCL 50 MG/ML IJ SOLN
50.0000 mg | Freq: Once | INTRAMUSCULAR | Status: AC
  Administered 2021-07-05: 50 mg via INTRAVENOUS
  Filled 2021-07-05: qty 1

## 2021-07-05 MED ORDER — IOHEXOL 300 MG/ML  SOLN
75.0000 mL | Freq: Once | INTRAMUSCULAR | Status: AC | PRN
  Administered 2021-07-05: 75 mL via INTRAVENOUS

## 2021-07-05 MED ORDER — METOCLOPRAMIDE HCL 5 MG/ML IJ SOLN
10.0000 mg | Freq: Once | INTRAMUSCULAR | Status: AC
  Administered 2021-07-05: 10 mg via INTRAVENOUS
  Filled 2021-07-05: qty 2

## 2021-07-05 MED ORDER — AMOXICILLIN-POT CLAVULANATE 875-125 MG PO TABS
1.0000 | ORAL_TABLET | Freq: Once | ORAL | Status: AC
  Administered 2021-07-05: 1 via ORAL
  Filled 2021-07-05: qty 1

## 2021-07-05 MED ORDER — AMOXICILLIN-POT CLAVULANATE 875-125 MG PO TABS: 1.0000 | ORAL_TABLET | Freq: Two times a day (BID) | ORAL | 0 refills | Status: AC

## 2021-07-05 MED ORDER — KETOROLAC TROMETHAMINE 30 MG/ML IJ SOLN
30.0000 mg | Freq: Once | INTRAMUSCULAR | Status: AC
  Administered 2021-07-05: 30 mg via INTRAVENOUS
  Filled 2021-07-05: qty 1

## 2021-07-05 MED ORDER — SODIUM CHLORIDE 0.9 % IV BOLUS
1000.0000 mL | Freq: Once | INTRAVENOUS | Status: AC
  Administered 2021-07-05: 1000 mL via INTRAVENOUS

## 2021-07-05 NOTE — ED Provider Notes (Signed)
[facility] Provider Note    Event Date/Time   First MD Initiated Contact with Patient 07/05/21 0214     (approximate)  History   Chief Complaint: Facial Pain and Post-op Problem (/)  HPI  Caitlin Tucker is a 61 y.o. female with a past medical history of anxiety, fibromyalgia, hypertension presents to the emergency department for right facial pain.  According to the patient and record review patient had a surgery 07/01/2021.  I reviewed the operative note patient had a maxillary sinus surgery by Dr. Constance Holster.  Patient states since the surgery she has continued to have pain in the right face has noticed some mild bruising to the right face as well and now states she has a headache.  Patient has had a small amount of blood-tinged nasal discharge which she was told would be normal.  Denies any known fever.  Patient was concerned so she came to the emergency department for evaluation.  Physical Exam   Triage Vital Signs: ED Triage Vitals  Enc Vitals Group     BP 07/05/21 0205 (!) 147/85     Pulse Rate 07/05/21 0205 88     Resp 07/05/21 0205 16     Temp 07/05/21 0205 98.5 F (36.9 C)     Temp Source 07/05/21 0205 Oral     SpO2 07/05/21 0205 95 %     Weight 07/05/21 0206 130 lb (59 kg)     Height 07/05/21 0206 4\' 11"  (1.499 m)     Head Circumference --      Peak Flow --      Pain Score 07/05/21 0206 10     Pain Loc --      Pain Edu? --      Excl. in [place]? --     Most recent vital signs: Vitals:   07/05/21 0205  BP: (!) 147/85  Pulse: 88  Resp: 16  Temp: 98.5 F (36.9 C)  SpO2: 95%    General: Awake, no distress.  CV:  Good peripheral perfusion.  Regular rate and rhythm  Resp:  Normal effort.  Equal breath sounds bilaterally.  Abd:  No distention.  Soft, nontender.  No rebound or guarding. Other:  Patient has mild amount of ecchymosis overlying the right maxillary sinus.  No significant edema.  No periorbital edema.   ED Results / Procedures /  Treatments   RADIOLOGY  I have interpreted the CT head images no large bleed on my evaluation. CT scan shows opacification of the right sided sinuses otherwise no concerning abnormality.   MEDICATIONS ORDERED IN ED: Medications  ketorolac (TORADOL) 30 MG/ML injection 30 mg (30 mg Intravenous Given 07/05/21 0238)  metoCLOPramide (REGLAN) injection 10 mg (10 mg Intravenous Given 07/05/21 0238)  diphenhydrAMINE (BENADRYL) injection 50 mg (50 mg Intravenous Given 07/05/21 0238)  sodium chloride 0.9 % bolus 1,000 mL (1,000 mLs Intravenous New Bag/Given 07/05/21 0240)     IMPRESSION / MDM / ASSESSMENT AND PLAN / ED COURSE  I reviewed the triage vital signs and the nursing notes.  Patient presents emergency department for worsening headache as well as right facial pain since having maxillary sinus surgery 4 days ago.  Overall the patient appears well, reassuring vitals, afebrile.  Patient does state tenderness over the right maxillary sinus.  There is no significant edema there is a mild amount of ecchymosis however patient just had surgery 4 days ago.  We will obtain CT imaging of the head and maxillofacial CT as well  to evaluate.  Differential would include tension headache, migraine, sinusitis, infection, sinus pressure, postoperative pain.  CT scan shows opacification of the right sided sinuses could be related to postoperative inflammation or possible sinusitis we will cover with Augmentin as a precaution.  We will have the patient follow-up with her doctor.  I discussed with the patient to continue to take her pain medication as prescribed by her surgeon as needed or instead use Tylenol but not over 4 g/day.  FINAL CLINICAL IMPRESSION(S) / ED DIAGNOSES   Right facial pain Sinusitis  Note:  This document was prepared using Dragon voice recognition software and may include unintentional dictation errors.   Harvest Dark, MD 07/05/21 [phone]

## 2021-07-05 NOTE — ED Triage Notes (Signed)
Pt to ED from home c/o facial pain and post op problem. States had right facial surgery to remove infection that was unable to be cleared by ABX and states pain is worse and radiating to posterior head, more swelling, and trouble opening right eye.

## 2022-05-05 ENCOUNTER — Other Ambulatory Visit: Payer: Self-pay | Admitting: Physical Medicine & Rehabilitation

## 2022-05-05 DIAGNOSIS — M5414 Radiculopathy, thoracic region: Secondary | ICD-10-CM

## 2022-05-15 ENCOUNTER — Encounter: Payer: Self-pay | Admitting: Physical Medicine & Rehabilitation

## 2022-05-17 ENCOUNTER — Ambulatory Visit
Admission: RE | Admit: 2022-05-17 | Discharge: 2022-05-17 | Disposition: A | Discharge status: Home / Self Care | Payer: 59 | Source: Ambulatory Visit | Attending: Physical Medicine & Rehabilitation | Admitting: Physical Medicine & Rehabilitation

## 2022-05-17 DIAGNOSIS — M5414 Radiculopathy, thoracic region: Secondary | ICD-10-CM

## 2023-01-06 ENCOUNTER — Other Ambulatory Visit: Payer: Self-pay | Admitting: Physical Medicine & Rehabilitation

## 2023-01-06 DIAGNOSIS — G8929 Other chronic pain: Secondary | ICD-10-CM

## 2023-01-24 ENCOUNTER — Ambulatory Visit
Admission: RE | Admit: 2023-01-24 | Discharge: 2023-01-24 | Disposition: A | Discharge status: Home / Self Care | Payer: Commercial Managed Care - PPO | Source: Ambulatory Visit | Attending: Physical Medicine & Rehabilitation | Admitting: Physical Medicine & Rehabilitation

## 2023-01-24 DIAGNOSIS — G8929 Other chronic pain: Secondary | ICD-10-CM
